# Patient Record
Sex: Female | Born: 1965 | Race: White | Hispanic: No | Marital: Single
Health system: Southern US, Community
[De-identification: ages and names within clinical notes are randomized; demographics above are authoritative.]

## PROBLEM LIST (undated history)

## (undated) DIAGNOSIS — R011 Cardiac murmur, unspecified: Secondary | ICD-10-CM

## (undated) DIAGNOSIS — D649 Anemia, unspecified: Secondary | ICD-10-CM

## (undated) DIAGNOSIS — E288 Other ovarian dysfunction: Secondary | ICD-10-CM

## (undated) DIAGNOSIS — I10 Essential (primary) hypertension: Secondary | ICD-10-CM

## (undated) DIAGNOSIS — N390 Urinary tract infection, site not specified: Secondary | ICD-10-CM

## (undated) DIAGNOSIS — G4700 Insomnia, unspecified: Secondary | ICD-10-CM

## (undated) DIAGNOSIS — Z9889 Other specified postprocedural states: Secondary | ICD-10-CM

## (undated) DIAGNOSIS — R51 Headache: Secondary | ICD-10-CM

## (undated) DIAGNOSIS — I341 Nonrheumatic mitral (valve) prolapse: Secondary | ICD-10-CM

## (undated) DIAGNOSIS — E2839 Other primary ovarian failure: Secondary | ICD-10-CM

## (undated) DIAGNOSIS — R519 Headache, unspecified: Secondary | ICD-10-CM

## (undated) DIAGNOSIS — F419 Anxiety disorder, unspecified: Secondary | ICD-10-CM

## (undated) HISTORY — DX: Headache, unspecified: R51.9

## (undated) HISTORY — PX: ABDOMINAL HYSTERECTOMY: SHX81

## (undated) HISTORY — DX: Nonrheumatic mitral (valve) prolapse: I34.1

## (undated) HISTORY — DX: Other ovarian dysfunction: E28.8

## (undated) HISTORY — DX: Headache: R51

## (undated) HISTORY — DX: Anemia, unspecified: D64.9

## (undated) HISTORY — DX: Other primary ovarian failure: E28.39

## (undated) HISTORY — DX: Essential (primary) hypertension: I10

## (undated) HISTORY — PX: BUNIONECTOMY: SHX129

## (undated) HISTORY — DX: Cardiac murmur, unspecified: R01.1

## (undated) HISTORY — DX: Insomnia, unspecified: G47.00

---

## 1998-03-25 ENCOUNTER — Other Ambulatory Visit: Admission: RE | Admit: 1998-03-25 | Discharge: 1998-03-25 | Payer: Self-pay | Admitting: Obstetrics & Gynecology

## 1998-10-11 ENCOUNTER — Ambulatory Visit (HOSPITAL_COMMUNITY): Admission: RE | Admit: 1998-10-11 | Discharge: 1998-10-11 | Payer: Self-pay | Admitting: Gastroenterology

## 1999-05-22 ENCOUNTER — Inpatient Hospital Stay (HOSPITAL_COMMUNITY): Admission: AD | Admit: 1999-05-22 | Discharge: 1999-05-22 | Payer: Self-pay | Admitting: Obstetrics & Gynecology

## 1999-07-08 ENCOUNTER — Ambulatory Visit (HOSPITAL_COMMUNITY): Admission: RE | Admit: 1999-07-08 | Discharge: 1999-07-08 | Payer: Self-pay | Admitting: Obstetrics and Gynecology

## 1999-07-08 ENCOUNTER — Encounter: Payer: Self-pay | Admitting: Obstetrics and Gynecology

## 2000-02-16 ENCOUNTER — Encounter: Payer: Self-pay | Admitting: Gynecology

## 2000-02-16 ENCOUNTER — Ambulatory Visit (HOSPITAL_COMMUNITY): Admission: RE | Admit: 2000-02-16 | Discharge: 2000-02-16 | Payer: Self-pay | Admitting: Gynecology

## 2000-04-13 ENCOUNTER — Encounter: Admission: RE | Admit: 2000-04-13 | Discharge: 2000-04-13 | Payer: Self-pay | Admitting: Obstetrics and Gynecology

## 2000-04-13 ENCOUNTER — Encounter: Payer: Self-pay | Admitting: Obstetrics and Gynecology

## 2000-04-18 ENCOUNTER — Other Ambulatory Visit: Admission: RE | Admit: 2000-04-18 | Discharge: 2000-04-18 | Payer: Self-pay | Admitting: Obstetrics and Gynecology

## 2000-09-25 HISTORY — PX: HYSTEROSCOPY: SHX211

## 2001-03-04 ENCOUNTER — Other Ambulatory Visit: Admission: RE | Admit: 2001-03-04 | Discharge: 2001-03-04 | Payer: Self-pay | Admitting: *Deleted

## 2001-06-28 ENCOUNTER — Other Ambulatory Visit: Admission: RE | Admit: 2001-06-28 | Discharge: 2001-06-28 | Payer: Self-pay | Admitting: *Deleted

## 2001-06-28 ENCOUNTER — Encounter (INDEPENDENT_AMBULATORY_CARE_PROVIDER_SITE_OTHER): Payer: Self-pay | Admitting: Specialist

## 2001-12-31 ENCOUNTER — Encounter: Admission: RE | Admit: 2001-12-31 | Discharge: 2001-12-31 | Payer: Self-pay | Admitting: Obstetrics and Gynecology

## 2001-12-31 ENCOUNTER — Encounter: Payer: Self-pay | Admitting: Obstetrics and Gynecology

## 2002-03-27 ENCOUNTER — Other Ambulatory Visit: Admission: RE | Admit: 2002-03-27 | Discharge: 2002-03-27 | Payer: Self-pay | Admitting: Obstetrics and Gynecology

## 2002-07-26 ENCOUNTER — Inpatient Hospital Stay (HOSPITAL_COMMUNITY): Admission: AD | Admit: 2002-07-26 | Discharge: 2002-07-29 | Payer: Self-pay | Admitting: Obstetrics and Gynecology

## 2002-07-27 ENCOUNTER — Encounter (INDEPENDENT_AMBULATORY_CARE_PROVIDER_SITE_OTHER): Payer: Self-pay | Admitting: Specialist

## 2002-07-30 ENCOUNTER — Encounter: Admission: RE | Admit: 2002-07-30 | Discharge: 2002-08-29 | Payer: Self-pay | Admitting: Obstetrics and Gynecology

## 2002-09-23 ENCOUNTER — Other Ambulatory Visit: Admission: RE | Admit: 2002-09-23 | Discharge: 2002-09-23 | Payer: Self-pay | Admitting: Obstetrics and Gynecology

## 2004-04-11 ENCOUNTER — Other Ambulatory Visit: Admission: RE | Admit: 2004-04-11 | Discharge: 2004-04-11 | Payer: Self-pay | Admitting: Obstetrics and Gynecology

## 2005-12-01 ENCOUNTER — Encounter: Admission: RE | Admit: 2005-12-01 | Discharge: 2005-12-01 | Payer: Self-pay | Admitting: *Deleted

## 2006-04-12 ENCOUNTER — Encounter: Admission: RE | Admit: 2006-04-12 | Discharge: 2006-04-12 | Payer: Self-pay | Admitting: *Deleted

## 2009-03-02 ENCOUNTER — Ambulatory Visit: Payer: Self-pay | Admitting: Internal Medicine

## 2009-11-22 ENCOUNTER — Other Ambulatory Visit: Admission: RE | Admit: 2009-11-22 | Discharge: 2009-11-22 | Payer: Self-pay | Admitting: Obstetrics and Gynecology

## 2009-11-22 ENCOUNTER — Ambulatory Visit: Payer: Self-pay | Admitting: Women's Health

## 2009-12-06 ENCOUNTER — Ambulatory Visit: Payer: Self-pay | Admitting: Women's Health

## 2010-05-10 ENCOUNTER — Encounter: Admission: RE | Admit: 2010-05-10 | Discharge: 2010-05-10 | Payer: Self-pay | Admitting: Gynecology

## 2010-05-10 IMAGING — MG MM DIGITAL DIAGNOSTIC BILAT
5 series · 5 of 5 positions shown · non-contrast
Comparison: [DATE] and [DATE]

CLINICAL DATA: History of right breast cyst 9 o'clock location.
The patient was recommended to return for short-term follow-up in 6
months but did not return.  She is due for bilateral mammography
today.

DIGITAL DIAGNOSTIC BILATERAL MAMMOGRAM WITH CAD

[R CC (1 of 2)]
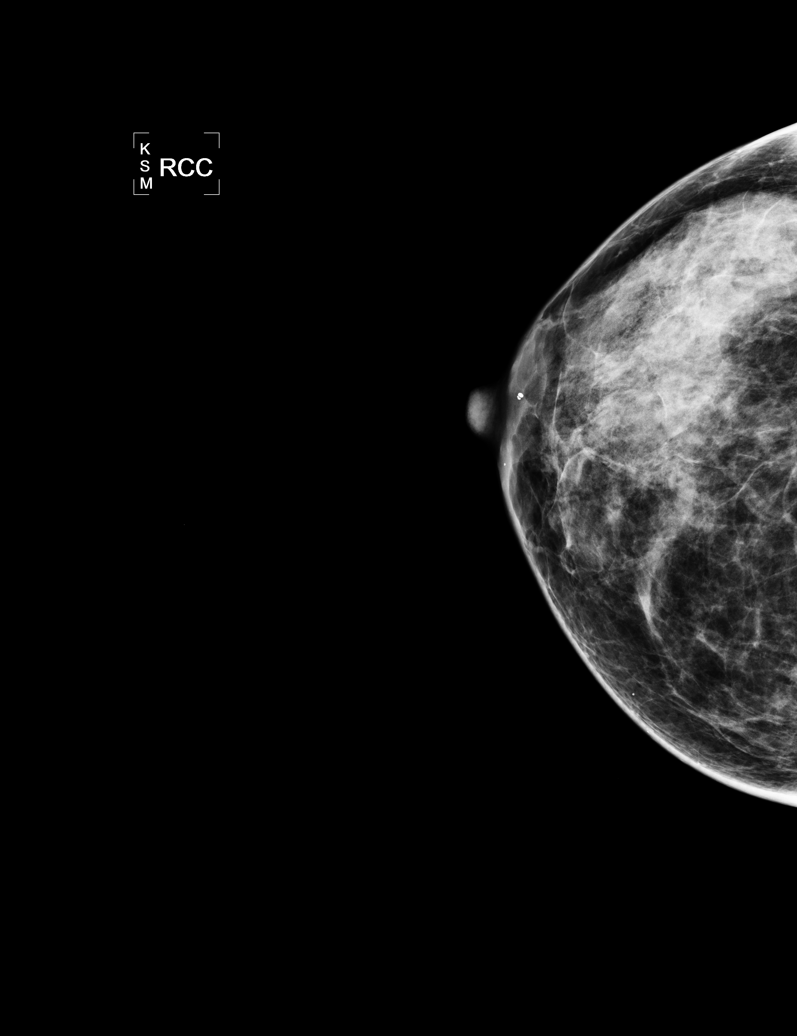

[L CC]
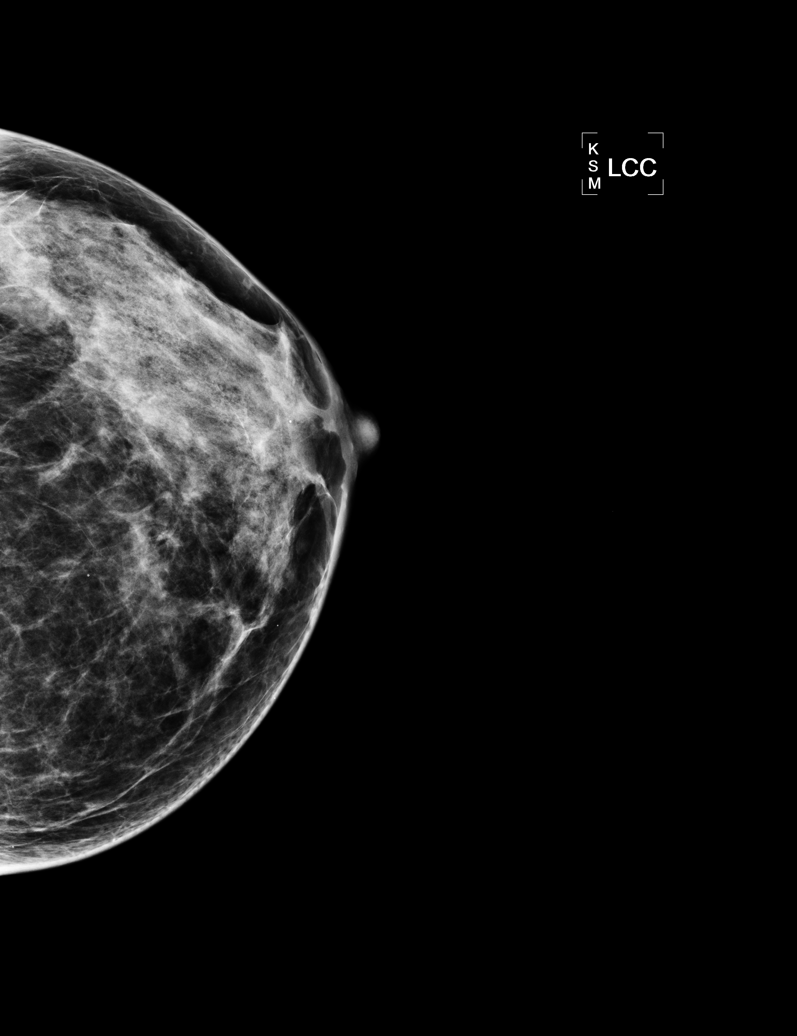

[L MLO]
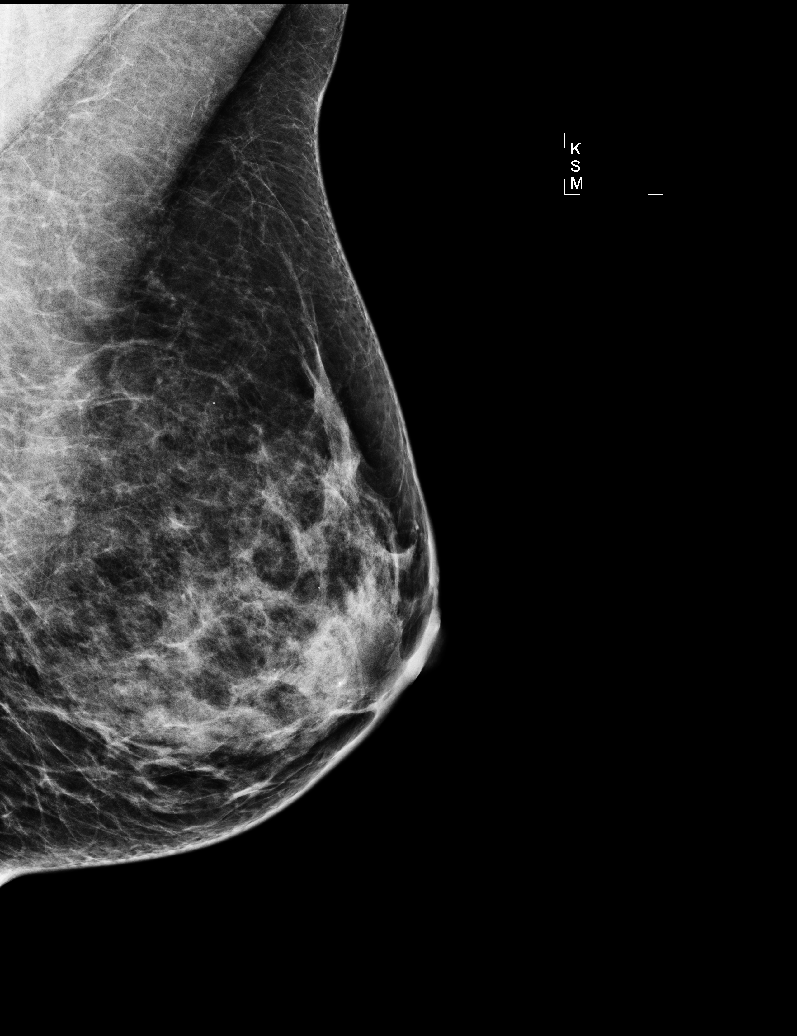

[R MLO]
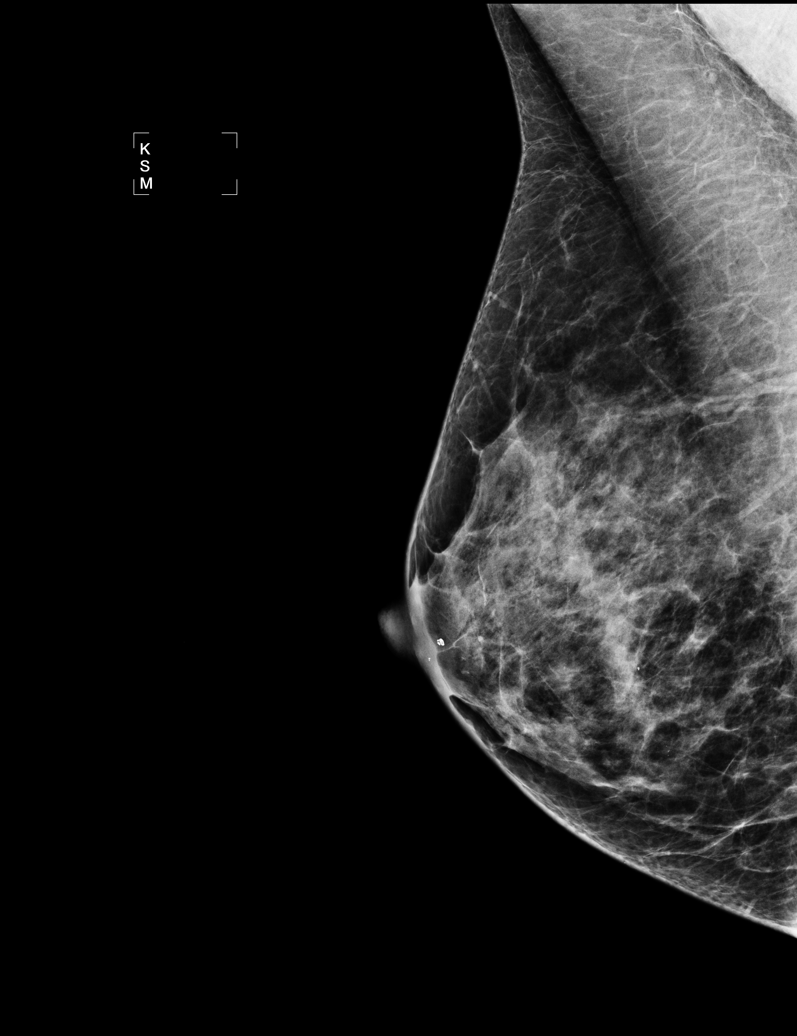

[R CC (2 of 2)]
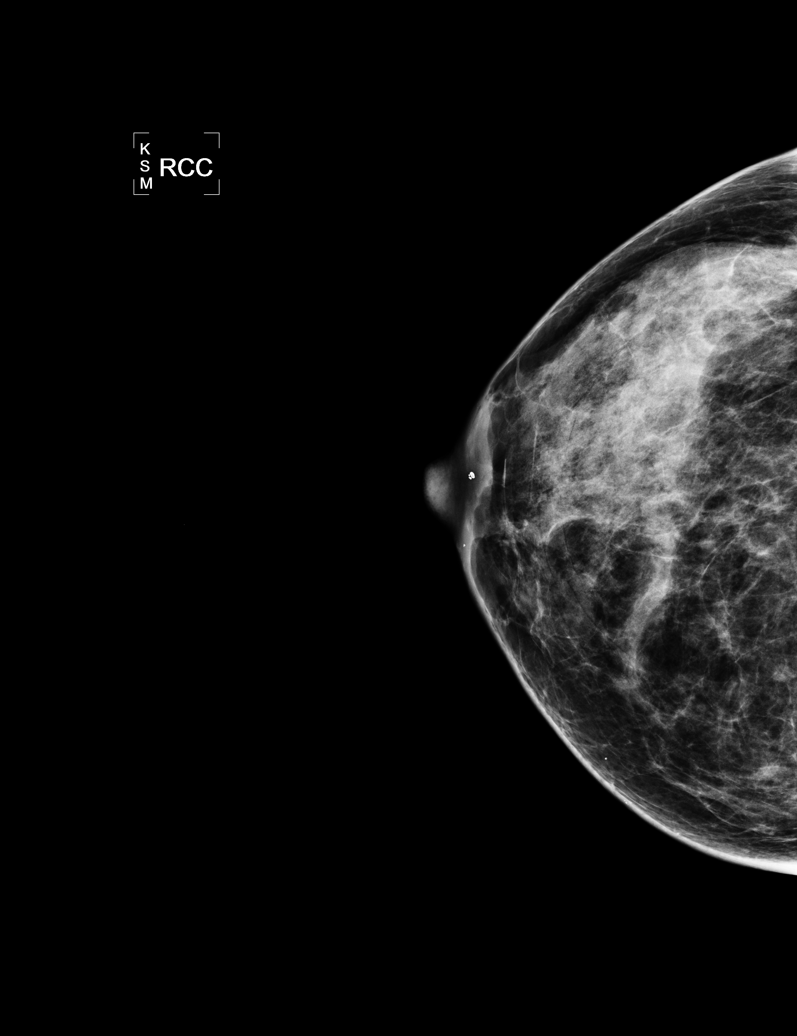

[5 of 5 positions shown; findings below may reference images not displayed]

FINDINGS: The breast parenchyma is heterogeneously dense, which
may limit the sensitivity of mammography.  No suspicious mass,
calcification, or architectural distortion is seen.
Mammographic images were processed with CAD.
IMPRESSION: No evidence for malignancy in either breast.  No change since prior
exam. Screening mammography is recommended in one year. Findings
and recommendations discussed with the patient and provided in
written form at the time of the exam.

BI-RADS CATEGORY 1:  Negative.

## 2010-11-07 ENCOUNTER — Encounter (INDEPENDENT_AMBULATORY_CARE_PROVIDER_SITE_OTHER): Payer: PRIVATE HEALTH INSURANCE | Admitting: Internal Medicine

## 2010-11-07 DIAGNOSIS — F411 Generalized anxiety disorder: Secondary | ICD-10-CM

## 2010-11-07 DIAGNOSIS — Z0000 Encounter for general adult medical examination without abnormal findings: Secondary | ICD-10-CM

## 2011-04-03 ENCOUNTER — Other Ambulatory Visit: Payer: Self-pay | Admitting: Gynecology

## 2011-04-03 ENCOUNTER — Other Ambulatory Visit (HOSPITAL_COMMUNITY)
Admission: RE | Admit: 2011-04-03 | Discharge: 2011-04-03 | Disposition: A | Discharge status: Home / Self Care | Payer: 59 | Source: Ambulatory Visit | Attending: Gynecology | Admitting: Gynecology

## 2011-04-03 ENCOUNTER — Encounter (INDEPENDENT_AMBULATORY_CARE_PROVIDER_SITE_OTHER): Payer: 59 | Admitting: Gynecology

## 2011-04-03 DIAGNOSIS — B373 Candidiasis of vulva and vagina: Secondary | ICD-10-CM

## 2011-04-03 DIAGNOSIS — Z13220 Encounter for screening for lipoid disorders: Secondary | ICD-10-CM

## 2011-04-03 DIAGNOSIS — Z833 Family history of diabetes mellitus: Secondary | ICD-10-CM

## 2011-04-03 DIAGNOSIS — R823 Hemoglobinuria: Secondary | ICD-10-CM

## 2011-04-03 DIAGNOSIS — N926 Irregular menstruation, unspecified: Secondary | ICD-10-CM

## 2011-04-03 DIAGNOSIS — Z124 Encounter for screening for malignant neoplasm of cervix: Secondary | ICD-10-CM | POA: Insufficient documentation

## 2011-04-03 DIAGNOSIS — Z01419 Encounter for gynecological examination (general) (routine) without abnormal findings: Secondary | ICD-10-CM

## 2011-04-07 ENCOUNTER — Ambulatory Visit (INDEPENDENT_AMBULATORY_CARE_PROVIDER_SITE_OTHER): Payer: 59 | Admitting: Gynecology

## 2011-04-07 DIAGNOSIS — N926 Irregular menstruation, unspecified: Secondary | ICD-10-CM

## 2011-04-10 ENCOUNTER — Ambulatory Visit: Payer: 59 | Admitting: Gynecology

## 2011-04-11 ENCOUNTER — Encounter: Payer: Self-pay | Admitting: *Deleted

## 2011-05-01 ENCOUNTER — Telehealth: Payer: Self-pay | Admitting: *Deleted

## 2011-05-01 DIAGNOSIS — B373 Candidiasis of vulva and vagina: Secondary | ICD-10-CM

## 2011-05-01 NOTE — Telephone Encounter (Signed)
PT CALLING STATING SHE HAS A YEAST INFECTION. WHITE DISCHARGE, ITCHING X 3 DAYS NOW. PT STATES THAT SHE PICKED UP A DIFLUCAN FROM PHARMACY. PT WANTS TO KNOW IF SHE COULD HAVE MORE FOR HER RECURRENT YEAST INFECTIONS. PLEASE ADVISE.

## 2011-05-02 MED ORDER — FLUCONAZOLE 150 MG PO TABS: 150.0000 mg | ORAL_TABLET | Freq: Once | ORAL | Status: AC

## 2011-05-02 NOTE — Telephone Encounter (Signed)
LM ON PT VM THAT RX HAS BEEN SENT. AND TO FOLLOW UP OFFICE VISIT IF SYMPTOMS CONTINUE.

## 2011-05-02 NOTE — Telephone Encounter (Signed)
I prescribed Diflucan 150 mg with 2 refills. If she continues to have more recurrences that I recommend office visit to discuss suppressive therapy.

## 2011-05-08 ENCOUNTER — Ambulatory Visit: Payer: PRIVATE HEALTH INSURANCE | Admitting: Internal Medicine

## 2011-05-10 ENCOUNTER — Other Ambulatory Visit: Payer: Self-pay

## 2011-05-10 ENCOUNTER — Encounter: Payer: Self-pay | Admitting: Gynecology

## 2011-05-10 ENCOUNTER — Other Ambulatory Visit: Payer: 59

## 2011-05-10 ENCOUNTER — Ambulatory Visit (INDEPENDENT_AMBULATORY_CARE_PROVIDER_SITE_OTHER): Payer: 59 | Admitting: Gynecology

## 2011-05-10 DIAGNOSIS — N938 Other specified abnormal uterine and vaginal bleeding: Secondary | ICD-10-CM

## 2011-05-10 DIAGNOSIS — Z803 Family history of malignant neoplasm of breast: Secondary | ICD-10-CM

## 2011-05-10 DIAGNOSIS — N898 Other specified noninflammatory disorders of vagina: Secondary | ICD-10-CM

## 2011-05-10 DIAGNOSIS — N949 Unspecified condition associated with female genital organs and menstrual cycle: Secondary | ICD-10-CM

## 2011-05-10 DIAGNOSIS — L293 Anogenital pruritus, unspecified: Secondary | ICD-10-CM

## 2011-05-10 DIAGNOSIS — R142 Eructation: Secondary | ICD-10-CM

## 2011-05-10 DIAGNOSIS — B373 Candidiasis of vulva and vagina: Secondary | ICD-10-CM

## 2011-05-10 DIAGNOSIS — R140 Abdominal distension (gaseous): Secondary | ICD-10-CM

## 2011-05-10 MED ORDER — FLUCONAZOLE 150 MG PO TABS: 150.0000 mg | ORAL_TABLET | Freq: Once | ORAL | Status: AC

## 2011-05-10 MED ORDER — FLUCONAZOLE 200 MG PO TABS: 200.0000 mg | ORAL_TABLET | Freq: Every day | ORAL | Status: AC

## 2011-05-10 NOTE — Progress Notes (Signed)
Patient presents several issues. #1 recurrent vaginitis history of frequent yeast infections.  She was recently treated with Diflucan but this seemed to have failed. She is on a chronic suppressive dose of antibiotics for recurrent UTIs.  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  An ultrasound today to look at endometrial stripe and to check the ovaries for activity.  Ultrasound shows endometrial echo at 7.7 mm right left ovaries with echo-free avascular cysts on the left 24 mm on the right 21 mm.  Exam External BUS vagina with white discharge KOH wet prep done cervix normal, uterus normal size midline mobile nontender adnexa without masses or tenderness  #1  Wet prep positive for yeast we'll treat with Diflucan 200 mg daily for 5 days. I gave her a separate prescription for Diflucan 150 mg #4 with 2 refills of available to treat intermittently as needed for recurrences. We discussed alternative options such as boric acid suppositories she apparently tried this previously and didn't like the mess. #2 History of premature ovarian failure. Ultrasound shows a ovarian activity endometrial echo is 7.7 mm. Her FSH is normal her estradiol is 230. Will plan observation and she'll keep a menstrual calendar if she resumes relatively regular menses we'll follow if significant irregular bleeding she'll return present for evaluation. Using vasectomy birth control.

## 2011-06-14 ENCOUNTER — Encounter: Payer: Self-pay | Admitting: Gynecology

## 2011-07-13 ENCOUNTER — Telehealth: Payer: Self-pay | Admitting: Internal Medicine

## 2011-07-14 ENCOUNTER — Other Ambulatory Visit: Payer: Self-pay

## 2011-07-14 MED ORDER — VALACYCLOVIR HCL 500 MG PO TABS: 500.0000 mg | ORAL_TABLET | Freq: Two times a day (BID) | ORAL | Status: AC

## 2011-07-14 NOTE — Telephone Encounter (Signed)
rx called to pharmacy and patient informed

## 2011-07-14 NOTE — Telephone Encounter (Signed)
RX: Valtrex 500mg  #10 one po bid with prn 0ne year refills to be called in by nurse

## 2011-08-04 ENCOUNTER — Telehealth: Payer: Self-pay | Admitting: *Deleted

## 2011-08-04 MED ORDER — FLUCONAZOLE 150 MG PO TABS: 150.0000 mg | ORAL_TABLET | Freq: Once | ORAL | Status: AC

## 2011-08-04 NOTE — Telephone Encounter (Signed)
Pt calling complaining of recurrent  yeast infection, she had two refills on diflucan 150 mg on 05/10/11 and was told if symptoms should occur to make office visit. Pt informed with this, called today and is leaving town on Sunday and wants rx for diflucan. Please advise.

## 2011-08-04 NOTE — Telephone Encounter (Signed)
Okay for one refill of Diflucan 150x1 order was placed

## 2011-08-04 NOTE — Telephone Encounter (Signed)
Pt informed with the below note. 

## 2011-08-04 NOTE — Telephone Encounter (Signed)
error 

## 2011-09-04 ENCOUNTER — Ambulatory Visit: Payer: 59 | Admitting: Gynecology

## 2011-09-05 ENCOUNTER — Encounter: Payer: Self-pay | Admitting: Gynecology

## 2011-09-05 ENCOUNTER — Ambulatory Visit (INDEPENDENT_AMBULATORY_CARE_PROVIDER_SITE_OTHER): Payer: 59 | Admitting: Gynecology

## 2011-09-05 DIAGNOSIS — N898 Other specified noninflammatory disorders of vagina: Secondary | ICD-10-CM

## 2011-09-05 DIAGNOSIS — N926 Irregular menstruation, unspecified: Secondary | ICD-10-CM

## 2011-09-05 DIAGNOSIS — B373 Candidiasis of vulva and vagina: Secondary | ICD-10-CM

## 2011-09-05 DIAGNOSIS — B3731 Acute candidiasis of vulva and vagina: Secondary | ICD-10-CM

## 2011-09-05 MED ORDER — MEDROXYPROGESTERONE ACETATE 10 MG PO TABS: 10.0000 mg | ORAL_TABLET | Freq: Two times a day (BID) | ORAL | Status: DC

## 2011-09-05 MED ORDER — FLUCONAZOLE 200 MG PO TABS: 200.0000 mg | ORAL_TABLET | Freq: Every day | ORAL | Status: AC

## 2011-09-05 NOTE — Progress Notes (Signed)
Patient presents with recurrent vaginal discharge and itching. She has been treated for recurrent yeast infections.  She has been checked as far as glucose in the past and has been normal. Patient also notes her periods are a little irregular she will go up to 3 months without a period. She does have a history of premature menopause with elevated FSH in the past and then most recently has resumed menses with a normal FSH and estradiol level. She is using vasectomy for birth control.  Exam with chaperone present Pelvic external BUS vagina with white discharge KOH wet prep done. Cervix normal. Uterus retroverted normal size midline mobile nontender. Adnexa without masses or tenderness  Assessment and plan: 1. Recurrent yeast vaginitis. Her wet prep is positive for yeast. We'll treat with Diflucan 200 daily for 7 days then 200 weekly for 6 months. Patient will follow up if she has any recurrences. 2. Irregular menses. I think this is irregular ovulation. Discussed regulation with a Provera 10 mg twice a day x5 day withdrawal if she does not have a period every 5-6 weeks. She does have a history of normal hormone studies as far as TSH, prolactin, FSH this past July. If significant irregularity despite this she will call for further evaluation. Otherwise she is due for her annual in July and I reminded her to schedule this.

## 2011-09-05 NOTE — Patient Instructions (Addendum)
Take Diflucan and Provera as discussed. If recurrent vaginal symptoms or menstrual irregularity represent for further evaluation.  Otherwise follow up in July when you're due for your annual exam.

## 2011-10-03 ENCOUNTER — Telehealth: Payer: Self-pay | Admitting: *Deleted

## 2011-10-03 ENCOUNTER — Encounter: Payer: Self-pay | Admitting: *Deleted

## 2011-10-03 DIAGNOSIS — N852 Hypertrophy of uterus: Secondary | ICD-10-CM

## 2011-10-03 NOTE — Telephone Encounter (Signed)
I would recommend that the patient schedule a GYN ultrasound here as there ultrasound is very limited meant to look at the bladder.  I can then take a look at the ultrasound and we can further discuss.

## 2011-10-03 NOTE — Progress Notes (Signed)
Patient ID: Michelle Kramer, female   DOB: 1965/10/08, 46 y.o.   MRN: [id number] Pt called Friday wanting to know if we received that notes from [facility] urology office, left message on pt vm that notes have been reviewed.

## 2011-10-03 NOTE — Telephone Encounter (Signed)
Pt had appointment with [facility] on 09/29/11 and was told that she appeared to have a enlarged uterus, pt wants to know if she should make appointment based on the results? Office note has been reviewed and signed off by you. Please advise

## 2011-10-04 NOTE — Telephone Encounter (Signed)
Pt informed with the below note and will call back to make appointment.

## 2011-10-04 NOTE — Telephone Encounter (Signed)
Pt informed with the below note, she will call back to schedule.

## 2011-10-16 ENCOUNTER — Ambulatory Visit (INDEPENDENT_AMBULATORY_CARE_PROVIDER_SITE_OTHER): Payer: 59 | Admitting: Gynecology

## 2011-10-16 ENCOUNTER — Ambulatory Visit (INDEPENDENT_AMBULATORY_CARE_PROVIDER_SITE_OTHER): Payer: 59

## 2011-10-16 ENCOUNTER — Encounter: Payer: Self-pay | Admitting: Gynecology

## 2011-10-16 DIAGNOSIS — N852 Hypertrophy of uterus: Secondary | ICD-10-CM

## 2011-10-16 DIAGNOSIS — O34539 Maternal care for retroversion of gravid uterus, unspecified trimester: Secondary | ICD-10-CM

## 2011-10-16 DIAGNOSIS — N854 Malposition of uterus: Secondary | ICD-10-CM

## 2011-10-16 MED ORDER — FLUCONAZOLE 200 MG PO TABS: 200.0000 mg | ORAL_TABLET | Freq: Every day | ORAL | Status: AC

## 2011-10-16 NOTE — Patient Instructions (Signed)
Follow up in 3 months for reexamination. 

## 2011-10-16 NOTE — Progress Notes (Signed)
Patient presents having been seen at [facility] urology for recurrent UTIs. They did an ultrasound for urinary retention and noted that her uterus looked more generous and referred her to me. She does have a history of heavier periods and irregular periods that we have discussed with her last office visit.   Exam with Sherrilyn Rist chaperone present External BUS vagina with menses flow. Cervix normal. Uterus retroverted globoid generous in size. Adnexa without masses or tenderness  Ultrasound shows endometrial echo at 4.0 mm. Uterus is generous at 126 mm x 69 mm x 49 mm. No specific lesions such as leiomyoma or adenomyosis suspect noted. Right and left ovaries visualized and normal.  Assessment and plan: Generous size uterus no specific lesions. Ultrasound in August 2012 showed measurements at 87 mm x 72 mm x 56 mm. Does seem to have some enlargement since then. Reviewed all this with the patient recommended she represent in 3 months for pelvic exam as she is easy to exam and and will reevaluate and see what she's doing at that time. She is comfortable with the plan.

## 2012-01-02 ENCOUNTER — Ambulatory Visit: Payer: 59 | Admitting: Gynecology

## 2012-01-04 ENCOUNTER — Ambulatory Visit (INDEPENDENT_AMBULATORY_CARE_PROVIDER_SITE_OTHER): Payer: 59 | Admitting: Gynecology

## 2012-01-04 ENCOUNTER — Encounter: Payer: Self-pay | Admitting: Gynecology

## 2012-01-04 VITALS — BP 118/72

## 2012-01-04 DIAGNOSIS — N926 Irregular menstruation, unspecified: Secondary | ICD-10-CM

## 2012-01-04 DIAGNOSIS — N920 Excessive and frequent menstruation with regular cycle: Secondary | ICD-10-CM

## 2012-01-04 MED ORDER — CEPHALEXIN 250 MG PO TABS: 250.0000 mg | ORAL_TABLET | ORAL | Status: DC | PRN

## 2012-01-04 MED ORDER — CLONAZEPAM 0.5 MG PO TABS: 0.5000 mg | ORAL_TABLET | Freq: Two times a day (BID) | ORAL | Status: DC | PRN

## 2012-01-04 NOTE — Patient Instructions (Signed)
Office will contact you to schedule surgery. 

## 2012-01-04 NOTE — Progress Notes (Signed)
Patient presents in follow up. She has a long history of irregular menses for years. Went through a transient premature menopause with elevated FSH and cessation of menses. She then resumed ovarian function and now has irregular menses. She'll bleed up to 2 weeks straight. Recent evaluation with ultrasound showed an enlarged uterus without focal abnormalities suspicious for adenomyosis. She's also having deep dyspareunia with intercourse as if something is being hit. Her uterus is retroverted. I asked her to come back in 3 months and follow up just to make sure that her uterus is not continuing to enlarge in and see how her menstrual pattern is. She did have normal hormone studies this past year.  Exam with Elane Fritz chaperone present External BUS vagina normal. Cervix normal. Uterus boggy retroverted. Adnexa without masses or tenderness.  Assessment and plan: Long history of irregular heavy menses. Had undergone testing in the past to include sonohysterogram. Most recent ultrasound shows an enlarged homogeneous myometrial pattern, thin endometrium at 4 mm.  Options for management were reviewed to include expectant management, hormonal manipulation such as oral contraceptives, Mirena IUD, endometrial ablation, hysterectomy. Coverings issue is her husband has a vasectomy and she wants no further children. She had been on oral contraceptives previously but had a hypertensive reaction. After lengthy discussion as to the pros/cons, risks/benefits of each choice she wants to proceed with hysterectomy which I think given her total picture is very reasonable. The ovarian conservation issue was reviewed. She has somewhat interesting history of premature menopause which then spontaneously resolved. The issues of continued hormone production versus removing her ovaries in the issues of symptoms, accelerated cardiovascular and osteoporotic risks were reviewed. The risks of ovarian disease and ovarian cancer if she keeps her  ovaries was discussed. Patient would want to keep her ovaries accepts the risks of ovarian disease. I think she would be a candidate for a vaginal approach but understands that a laparoscopic assistance up to and including TAH may be required and she would understand and accept this. Ahead and move towards scheduling this for her and she'll represent for a full preop consult.  She did ask if I could refill her cephalexin that she uses for UTI suppression as per urology and I did this for her as well as her clonazepam that she uses as a sleep aid that she normally gets through her primary physician. I prescribed #30 for both with 2 refills.

## 2012-01-09 ENCOUNTER — Telehealth: Payer: Self-pay

## 2012-01-09 NOTE — Telephone Encounter (Signed)
I called patient to schedule Vag Hyst. Per Dr. Velvet Bathe.  Patient said she was having second thoughts. She said she thought she asked all her questions the day she was here but after she got home she thought of others.  She had quite a few questions and I offered consult to come back and talk with Dr. Velvet Bathe but she said she had missed work several times to come in about this and if she could just get answers to her questions through me she would appreciate it.  1.  She asked about endometrial ablation.  I explained it to her.  She asked if that would be an option for her?    2.  Would endo ablation help to shrink her "enlarged uterus" ? (She is concerned that doing the ablation would not addressed the enlarged uterus.)  3.  She said that you told her you thought it was probably adenomyosis causing her uterus to be enlarged. She is bothered it was not confirmed.  She wonders could it be anything else causing it to be enlarged and is there anyway to know for sure (besides Hysterectomy)?  4.  She questioned if she should just go ahead and have her uterus removed in light of the enlargement and not even entertain the idea of ablation?

## 2012-01-10 NOTE — Telephone Encounter (Signed)
I called the patient on the phone and discussed the questions that she had. She wondered about endometrial ablation which I think from a heavy period standpoint would be appropriate but from her irregular standpoint that she may continue to have lighter but irregular menses. She's also having deep dyspareunia which I think is due to her retroverted uterus and I'm not sure that the ablation would address this issue. If we do want to consider the ablation or observation I recommended an endometrial sample given irregular bleeding just to make sure as a baseline if her endometrium is normal. If we decide for hysterectomy then I do not think this is necessary as we will obviously be providing them with the specimen. After a lengthy discussion the patient wants to go ahead with hysterectomy and will schedule this at her convenience. We did discuss the ovarian conservation issue again particularly in light of her past history of premature menopause and now reawakening of ovarian function. She does want to keep both ovaries if possible and accepts risk of ovarian disease both benign and malignant in the future.

## 2012-01-22 ENCOUNTER — Telehealth: Payer: Self-pay | Admitting: Gynecology

## 2012-01-22 NOTE — Telephone Encounter (Signed)
Patient called and asked me to cancel her surgery for June 9.  She said she is just too "on the fence" about it.  She would like to get a 2nd opinion.  She said she needs to be at peace about her decision.  She asked me to explain to Dr. Velvet Bathe that she wants him to do her surgery if she can feel more confident about having it and know that she is doing the right thing.  She asked for a records release form and I have faxed that to her.  She will call me when/if she decides she wants to re-schedule her hysterectomy.

## 2012-02-22 ENCOUNTER — Institutional Professional Consult (permissible substitution): Payer: 59 | Admitting: Gynecology

## 2012-02-29 ENCOUNTER — Encounter (HOSPITAL_COMMUNITY): Admission: RE | Payer: Self-pay | Source: Ambulatory Visit

## 2012-02-29 ENCOUNTER — Ambulatory Visit (HOSPITAL_COMMUNITY): Admission: RE | Admit: 2012-02-29 | Payer: 59 | Source: Ambulatory Visit | Admitting: Gynecology

## 2012-02-29 SURGERY — HYSTERECTOMY, VAGINAL
Anesthesia: General

## 2012-03-11 ENCOUNTER — Other Ambulatory Visit: Payer: Self-pay | Admitting: Obstetrics

## 2012-03-11 DIAGNOSIS — N644 Mastodynia: Secondary | ICD-10-CM

## 2012-03-18 ENCOUNTER — Ambulatory Visit
Admission: RE | Admit: 2012-03-18 | Discharge: 2012-03-18 | Disposition: A | Discharge status: Home / Self Care | Payer: 59 | Source: Ambulatory Visit | Attending: Obstetrics | Admitting: Obstetrics

## 2012-03-18 ENCOUNTER — Other Ambulatory Visit: Payer: Self-pay | Admitting: Obstetrics

## 2012-03-18 DIAGNOSIS — N644 Mastodynia: Secondary | ICD-10-CM

## 2012-05-10 ENCOUNTER — Encounter (HOSPITAL_COMMUNITY): Payer: Self-pay | Admitting: Pharmacist

## 2012-05-10 ENCOUNTER — Other Ambulatory Visit: Payer: Self-pay | Admitting: Obstetrics

## 2012-05-14 ENCOUNTER — Encounter (HOSPITAL_COMMUNITY): Payer: Self-pay

## 2012-05-14 ENCOUNTER — Encounter (HOSPITAL_COMMUNITY)
Admission: RE | Admit: 2012-05-14 | Discharge: 2012-05-14 | Disposition: A | Discharge status: Home / Self Care | Payer: 59 | Source: Ambulatory Visit | Attending: Obstetrics | Admitting: Obstetrics

## 2012-05-14 HISTORY — DX: Anxiety disorder, unspecified: F41.9

## 2012-05-14 HISTORY — DX: Urinary tract infection, site not specified: N39.0

## 2012-05-14 LAB — CBC
HCT: 37.4 % (ref 36.0–46.0)
Hemoglobin: 12.2 g/dL (ref 12.0–15.0)
MCH: 28.1 pg (ref 26.0–34.0)
MCHC: 32.6 g/dL (ref 30.0–36.0)
MCV: 86.2 fL (ref 78.0–100.0)
RDW: 13.1 % (ref 11.5–15.5)

## 2012-05-14 NOTE — Patient Instructions (Addendum)
20 Sundai G Riddle  05/14/2012   Your procedure is scheduled on:  05/21/12  Enter through the Main Entrance of [facility] at 6 AM.  Pick up the phone at the desk and dial [phone].   Call this number if you have problems the morning of surgery: [phone]   Remember:   Do not eat food:After Midnight.  Do not drink clear liquids: After Midnight.  Take these medicines the morning of surgery with A SIP OF WATER: Klonopin if needed   Do not wear jewelry, make-up or nail polish.  Do not wear lotions, powders, or perfumes. You may wear deodorant.  Do not shave 48 hours prior to surgery.  Do not bring valuables to the hospital.  Contacts, dentures or bridgework may not be worn into surgery.  Leave suitcase in the car. After surgery it may be brought to your room.  For patients admitted to the hospital, checkout time is 11:00 AM the day of discharge.   Patients discharged the day of surgery will not be allowed to drive home.  Name and phone number of your driver: NA  Special Instructions: CHG Shower Use Special Wash: 1/2 bottle night before surgery and 1/2 bottle morning of surgery.   Please read over the following fact sheets that you were given: MRSA Information

## 2012-05-20 ENCOUNTER — Encounter (HOSPITAL_COMMUNITY): Payer: Self-pay | Admitting: Anesthesiology

## 2012-05-20 NOTE — Anesthesia Preprocedure Evaluation (Addendum)
Anesthesia Evaluation  Patient identified by MRN, date of birth, ID band Patient awake    Reviewed: Allergy & Precautions, H&P , NPO status , Patient's Chart, lab work & pertinent test results  History of Anesthesia Complications (+) PONV  Airway Mallampati: II TM Distance: >3 FB Neck ROM: Full    Dental No notable dental hx. (+) Teeth Intact   Pulmonary neg pulmonary ROS,  breath sounds clear to auscultation  Pulmonary exam normal       Cardiovascular + Valvular Problems/Murmurs MVP Rhythm:Regular Rate:Normal     Neuro/Psych Anxiety    GI/Hepatic negative GI ROS, Neg liver ROS,   Endo/Other  negative endocrine ROS  Renal/GU negative Renal ROS   Chronic UTI    Musculoskeletal negative musculoskeletal ROS (+)   Abdominal   Peds  Hematology negative hematology ROS (+)   Anesthesia Other Findings   Reproductive/Obstetrics Menorrhagia Pelvic Pain                       Anesthesia Physical Anesthesia Plan  ASA: II  Anesthesia Plan: General   Post-op Pain Management:    Induction:   Airway Management Planned: Oral ETT  Additional Equipment:   Intra-op Plan:   Post-operative Plan: Extubation in OR  Informed Consent: I have reviewed the patients History and Physical, chart, labs and discussed the procedure including the risks, benefits and alternatives for the proposed anesthesia with the patient or authorized representative who has indicated his/her understanding and acceptance.   Dental advisory given  Plan Discussed with: CRNA, Anesthesiologist and Surgeon  Anesthesia Plan Comments:         Anesthesia Quick Evaluation

## 2012-05-21 ENCOUNTER — Encounter (HOSPITAL_COMMUNITY)
Admission: RE | Disposition: A | Discharge status: Home / Self Care | Payer: Self-pay | Source: Ambulatory Visit | Attending: Obstetrics

## 2012-05-21 ENCOUNTER — Ambulatory Visit (HOSPITAL_COMMUNITY): Payer: 59 | Admitting: Anesthesiology

## 2012-05-21 ENCOUNTER — Encounter (HOSPITAL_COMMUNITY): Payer: Self-pay

## 2012-05-21 ENCOUNTER — Encounter (HOSPITAL_COMMUNITY): Payer: Self-pay | Admitting: Anesthesiology

## 2012-05-21 ENCOUNTER — Encounter (HOSPITAL_COMMUNITY): Payer: Self-pay | Admitting: *Deleted

## 2012-05-21 ENCOUNTER — Ambulatory Visit (HOSPITAL_COMMUNITY)
Admission: RE | Admit: 2012-05-21 | Discharge: 2012-05-22 | Disposition: A | Discharge status: Home / Self Care | Payer: 59 | Source: Ambulatory Visit | Attending: Obstetrics | Admitting: Obstetrics

## 2012-05-21 DIAGNOSIS — N803 Endometriosis of pelvic peritoneum, unspecified: Secondary | ICD-10-CM | POA: Insufficient documentation

## 2012-05-21 DIAGNOSIS — N920 Excessive and frequent menstruation with regular cycle: Secondary | ICD-10-CM | POA: Insufficient documentation

## 2012-05-21 DIAGNOSIS — N946 Dysmenorrhea, unspecified: Secondary | ICD-10-CM | POA: Insufficient documentation

## 2012-05-21 DIAGNOSIS — Z9889 Other specified postprocedural states: Secondary | ICD-10-CM

## 2012-05-21 DIAGNOSIS — IMO0002 Reserved for concepts with insufficient information to code with codable children: Secondary | ICD-10-CM | POA: Insufficient documentation

## 2012-05-21 DIAGNOSIS — N949 Unspecified condition associated with female genital organs and menstrual cycle: Secondary | ICD-10-CM | POA: Insufficient documentation

## 2012-05-21 HISTORY — PX: LAPAROSCOPIC SUPRACERVICAL HYSTERECTOMY: SHX5399

## 2012-05-21 HISTORY — DX: Other specified postprocedural states: Z98.890

## 2012-05-21 SURGERY — HYSTERECTOMY, SUPRACERVICAL, LAPAROSCOPIC
Anesthesia: General | Site: Abdomen | Wound class: Clean Contaminated

## 2012-05-21 MED ORDER — PROPOFOL 10 MG/ML IV EMUL
INTRAVENOUS | Status: AC
  Filled 2012-05-21: qty 20

## 2012-05-21 MED ORDER — HYDROMORPHONE HCL PF 1 MG/ML IJ SOLN
0.2500 mg | INTRAMUSCULAR | Status: DC | PRN
  Administered 2012-05-21 (×2): 0.5 mg via INTRAVENOUS

## 2012-05-21 MED ORDER — BUPIVACAINE HCL (PF) 0.25 % IJ SOLN
INTRAMUSCULAR | Status: DC | PRN
  Administered 2012-05-21: 7 mL

## 2012-05-21 MED ORDER — HYDROMORPHONE HCL 2 MG PO TABS
4.0000 mg | ORAL_TABLET | ORAL | Status: DC | PRN
  Administered 2012-05-21: 4 mg via ORAL
  Administered 2012-05-21 – 2012-05-22 (×2): 2 mg via ORAL
  Administered 2012-05-22 (×2): 4 mg via ORAL
  Filled 2012-05-21 (×2): qty 1
  Filled 2012-05-21 (×3): qty 2

## 2012-05-21 MED ORDER — HYDROMORPHONE HCL PF 1 MG/ML IJ SOLN: 1.0000 mg | INTRAMUSCULAR | Status: DC | PRN

## 2012-05-21 MED ORDER — ARTIFICIAL TEARS OP OINT
TOPICAL_OINTMENT | OPHTHALMIC | Status: DC | PRN
  Administered 2012-05-21: 1 via OPHTHALMIC

## 2012-05-21 MED ORDER — ONDANSETRON HCL 4 MG PO TABS: 4.0000 mg | ORAL_TABLET | Freq: Four times a day (QID) | ORAL | Status: DC | PRN

## 2012-05-21 MED ORDER — DEXAMETHASONE SODIUM PHOSPHATE 10 MG/ML IJ SOLN
INTRAMUSCULAR | Status: AC
  Filled 2012-05-21: qty 1

## 2012-05-21 MED ORDER — LACTATED RINGERS IV SOLN: INTRAVENOUS | Status: DC

## 2012-05-21 MED ORDER — CLONAZEPAM 0.5 MG PO TABS: 0.2500 mg | ORAL_TABLET | Freq: Every evening | ORAL | Status: DC | PRN

## 2012-05-21 MED ORDER — BUPIVACAINE-EPINEPHRINE (PF) 0.5% -1:200000 IJ SOLN
INTRAMUSCULAR | Status: AC
  Filled 2012-05-21: qty 20

## 2012-05-21 MED ORDER — IBUPROFEN 600 MG PO TABS: 600.0000 mg | ORAL_TABLET | Freq: Four times a day (QID) | ORAL | Status: AC | PRN

## 2012-05-21 MED ORDER — ONDANSETRON HCL 4 MG/2ML IJ SOLN
INTRAMUSCULAR | Status: AC
  Filled 2012-05-21: qty 2

## 2012-05-21 MED ORDER — GLYCOPYRROLATE 0.2 MG/ML IJ SOLN
INTRAMUSCULAR | Status: DC | PRN
  Administered 2012-05-21: 0.4 mg via INTRAVENOUS
  Administered 2012-05-21: 0.1 mg via INTRAVENOUS

## 2012-05-21 MED ORDER — LIDOCAINE HCL (CARDIAC) 20 MG/ML IV SOLN
INTRAVENOUS | Status: DC | PRN
  Administered 2012-05-21: 60 mg via INTRAVENOUS

## 2012-05-21 MED ORDER — SCOPOLAMINE 1 MG/3DAYS TD PT72
1.0000 | MEDICATED_PATCH | Freq: Once | TRANSDERMAL | Status: DC
  Administered 2012-05-21: 1.5 mg via TRANSDERMAL

## 2012-05-21 MED ORDER — LACTATED RINGERS IV SOLN
INTRAVENOUS | Status: DC
  Administered 2012-05-21 (×3): via INTRAVENOUS

## 2012-05-21 MED ORDER — SCOPOLAMINE 1 MG/3DAYS TD PT72
MEDICATED_PATCH | TRANSDERMAL | Status: AC
  Administered 2012-05-21: 1.5 mg via TRANSDERMAL
  Filled 2012-05-21: qty 1

## 2012-05-21 MED ORDER — KETOROLAC TROMETHAMINE 30 MG/ML IJ SOLN: 30.0000 mg | Freq: Four times a day (QID) | INTRAMUSCULAR | Status: DC

## 2012-05-21 MED ORDER — MIDAZOLAM HCL 2 MG/2ML IJ SOLN
INTRAMUSCULAR | Status: AC
  Filled 2012-05-21: qty 2

## 2012-05-21 MED ORDER — METOCLOPRAMIDE HCL 5 MG/ML IJ SOLN: 10.0000 mg | Freq: Once | INTRAMUSCULAR | Status: DC | PRN

## 2012-05-21 MED ORDER — SODIUM CHLORIDE 0.9 % IV SOLN
INTRAVENOUS | Status: DC
  Administered 2012-05-21 (×2): via INTRAVENOUS

## 2012-05-21 MED ORDER — IBUPROFEN 600 MG PO TABS
600.0000 mg | ORAL_TABLET | Freq: Four times a day (QID) | ORAL | Status: DC | PRN
  Administered 2012-05-21 – 2012-05-22 (×4): 600 mg via ORAL
  Filled 2012-05-21 (×4): qty 1

## 2012-05-21 MED ORDER — ONDANSETRON HCL 4 MG/2ML IJ SOLN: 4.0000 mg | Freq: Four times a day (QID) | INTRAMUSCULAR | Status: DC | PRN

## 2012-05-21 MED ORDER — FENTANYL CITRATE 0.05 MG/ML IJ SOLN
INTRAMUSCULAR | Status: AC
  Filled 2012-05-21: qty 2

## 2012-05-21 MED ORDER — NEOSTIGMINE METHYLSULFATE 1 MG/ML IJ SOLN
INTRAMUSCULAR | Status: DC | PRN
  Administered 2012-05-21: 2 mg via INTRAVENOUS

## 2012-05-21 MED ORDER — ONDANSETRON HCL 4 MG/2ML IJ SOLN
INTRAMUSCULAR | Status: DC | PRN
  Administered 2012-05-21: 4 mg via INTRAVENOUS

## 2012-05-21 MED ORDER — ACETAMINOPHEN 10 MG/ML IV SOLN
INTRAVENOUS | Status: DC | PRN
  Administered 2012-05-21: 1000 mg via INTRAVENOUS

## 2012-05-21 MED ORDER — ROCURONIUM BROMIDE 50 MG/5ML IV SOLN
INTRAVENOUS | Status: AC
  Filled 2012-05-21: qty 2

## 2012-05-21 MED ORDER — ACETAMINOPHEN 10 MG/ML IV SOLN
1000.0000 mg | Freq: Four times a day (QID) | INTRAVENOUS | Status: DC
  Filled 2012-05-21 (×4): qty 100

## 2012-05-21 MED ORDER — CEFAZOLIN SODIUM-DEXTROSE 2-3 GM-% IV SOLR
2.0000 g | Freq: Once | INTRAVENOUS | Status: AC
  Administered 2012-05-21: 2 g via INTRAVENOUS

## 2012-05-21 MED ORDER — HYDROMORPHONE HCL PF 1 MG/ML IJ SOLN
INTRAMUSCULAR | Status: AC
  Administered 2012-05-21: 0.5 mg via INTRAVENOUS
  Filled 2012-05-21: qty 1

## 2012-05-21 MED ORDER — CEFAZOLIN SODIUM-DEXTROSE 2-3 GM-% IV SOLR
INTRAVENOUS | Status: AC
  Filled 2012-05-21: qty 50

## 2012-05-21 MED ORDER — ZOLPIDEM TARTRATE 5 MG PO TABS: 5.0000 mg | ORAL_TABLET | Freq: Every evening | ORAL | Status: DC | PRN

## 2012-05-21 MED ORDER — PROPOFOL 10 MG/ML IV EMUL
INTRAVENOUS | Status: DC | PRN
  Administered 2012-05-21: 150 mg via INTRAVENOUS

## 2012-05-21 MED ORDER — PANTOPRAZOLE SODIUM 40 MG PO TBEC
40.0000 mg | DELAYED_RELEASE_TABLET | Freq: Every day | ORAL | Status: DC
  Filled 2012-05-21 (×3): qty 1

## 2012-05-21 MED ORDER — FENTANYL CITRATE 0.05 MG/ML IJ SOLN
INTRAMUSCULAR | Status: AC
  Filled 2012-05-21: qty 5

## 2012-05-21 MED ORDER — MEPERIDINE HCL 25 MG/ML IJ SOLN: 6.2500 mg | INTRAMUSCULAR | Status: DC | PRN

## 2012-05-21 MED ORDER — KETOROLAC TROMETHAMINE 30 MG/ML IJ SOLN
INTRAMUSCULAR | Status: DC | PRN
  Administered 2012-05-21: 30 mg via INTRAVENOUS

## 2012-05-21 MED ORDER — ROCURONIUM BROMIDE 100 MG/10ML IV SOLN
INTRAVENOUS | Status: DC | PRN
  Administered 2012-05-21: 5 mg via INTRAVENOUS
  Administered 2012-05-21 (×2): 20 mg via INTRAVENOUS
  Administered 2012-05-21: 25 mg via INTRAVENOUS
  Administered 2012-05-21: 5 mg via INTRAVENOUS

## 2012-05-21 MED ORDER — DIPHENHYDRAMINE HCL 25 MG PO CAPS: 25.0000 mg | ORAL_CAPSULE | Freq: Four times a day (QID) | ORAL | Status: DC | PRN

## 2012-05-21 MED ORDER — MIDAZOLAM HCL 5 MG/5ML IJ SOLN
INTRAMUSCULAR | Status: DC | PRN
  Administered 2012-05-21: 2 mg via INTRAVENOUS

## 2012-05-21 MED ORDER — BUPIVACAINE HCL (PF) 0.25 % IJ SOLN
INTRAMUSCULAR | Status: AC
  Filled 2012-05-21: qty 60

## 2012-05-21 MED ORDER — DEXAMETHASONE SODIUM PHOSPHATE 10 MG/ML IJ SOLN
INTRAMUSCULAR | Status: DC | PRN
  Administered 2012-05-21: 10 mg via INTRAVENOUS

## 2012-05-21 MED ORDER — PROPOFOL 10 MG/ML IV BOLUS
INTRAVENOUS | Status: DC | PRN
  Administered 2012-05-21: 150 mg via INTRAVENOUS

## 2012-05-21 MED ORDER — MENTHOL 3 MG MT LOZG
1.0000 | LOZENGE | OROMUCOSAL | Status: DC | PRN
  Administered 2012-05-21: 3 mg via ORAL
  Filled 2012-05-21: qty 9

## 2012-05-21 MED ORDER — FENTANYL CITRATE 0.05 MG/ML IJ SOLN
INTRAMUSCULAR | Status: DC | PRN
  Administered 2012-05-21: 50 ug via INTRAVENOUS
  Administered 2012-05-21: 100 ug via INTRAVENOUS
  Administered 2012-05-21 (×2): 50 ug via INTRAVENOUS

## 2012-05-21 MED ORDER — HYDROMORPHONE HCL 4 MG PO TABS: 2.0000 mg | ORAL_TABLET | ORAL | Status: AC | PRN

## 2012-05-21 MED ORDER — LIDOCAINE HCL (CARDIAC) 20 MG/ML IV SOLN
INTRAVENOUS | Status: AC
  Filled 2012-05-21: qty 5

## 2012-05-21 SURGICAL SUPPLY — 71 items
BAG URINE DRAINAGE (UROLOGICAL SUPPLIES) ×3 IMPLANT
BARRIER ADHS 3X4 INTERCEED (GAUZE/BANDAGES/DRESSINGS) ×3 IMPLANT
BLADE LAP MORCELLATOR 15X9.5 (ELECTROSURGICAL) ×3 IMPLANT
BLADE LAPAROSCOPIC MORCELL KIT (BLADE) ×3 IMPLANT
BLADE SURG 15 STRL LF C SS BP (BLADE) ×2 IMPLANT
BLADE SURG 15 STRL SS (BLADE) ×1
CABLE HIGH FREQUENCY MONO STRZ (ELECTRODE) ×3 IMPLANT
CATH FOLEY 3WAY  5CC 16FR (CATHETERS) ×1
CATH FOLEY 3WAY 5CC 16FR (CATHETERS) ×2 IMPLANT
CHLORAPREP W/TINT 26ML (MISCELLANEOUS) ×3 IMPLANT
CLOTH BEACON ORANGE TIMEOUT ST (SAFETY) ×3 IMPLANT
CONT PATH 16OZ SNAP LID 3702 (MISCELLANEOUS) ×3 IMPLANT
COVER MAYO STAND STRL (DRAPES) ×3 IMPLANT
COVER TABLE BACK 60X90 (DRAPES) ×6 IMPLANT
COVER TIP SHEARS 8 DVNC (MISCELLANEOUS) ×2 IMPLANT
COVER TIP SHEARS 8MM DA VINCI (MISCELLANEOUS) ×1
DECANTER SPIKE VIAL GLASS SM (MISCELLANEOUS) ×3 IMPLANT
DERMABOND ADVANCED (GAUZE/BANDAGES/DRESSINGS) ×1
DERMABOND ADVANCED .7 DNX12 (GAUZE/BANDAGES/DRESSINGS) ×2 IMPLANT
DRAPE HUG U DISPOSABLE (DRAPE) ×3 IMPLANT
DRAPE LG THREE QUARTER DISP (DRAPES) ×6 IMPLANT
DRAPE MONITOR DA VINCI (DRAPE) ×3 IMPLANT
DRAPE WARM FLUID 44X44 (DRAPE) ×3 IMPLANT
ELECT REM PT RETURN 9FT ADLT (ELECTROSURGICAL) ×3
ELECTRODE REM PT RTRN 9FT ADLT (ELECTROSURGICAL) ×2 IMPLANT
EVACUATOR SMOKE 8.L (FILTER) ×3 IMPLANT
GAUZE VASELINE 3X9 (GAUZE/BANDAGES/DRESSINGS) IMPLANT
GLOVE BIO SURGEON STRL SZ 6.5 (GLOVE) ×18 IMPLANT
GLOVE BIO SURGEON STRL SZ7 (GLOVE) ×6 IMPLANT
GLOVE BIOGEL PI IND STRL 7.0 (GLOVE) ×8 IMPLANT
GLOVE BIOGEL PI INDICATOR 7.0 (GLOVE) ×4
GOWN PREVENTION PLUS LG XLONG (DISPOSABLE) ×9 IMPLANT
GOWN STRL REIN XL XLG (GOWN DISPOSABLE) ×18 IMPLANT
HEMOSTAT SURGICEL 2X14 (HEMOSTASIS) ×3 IMPLANT
IV STOPCOCK 4 WAY 40  W/Y SET (IV SOLUTION) ×1
IV STOPCOCK 4 WAY 40 W/Y SET (IV SOLUTION) ×2 IMPLANT
KIT ACCESSORY DA VINCI DISP (KITS) ×1
KIT ACCESSORY DVNC DISP (KITS) ×2 IMPLANT
KIT DISP ACCESSORY 4 ARM (KITS) IMPLANT
NEEDLE HYPO 22GX1.5 SAFETY (NEEDLE) ×3 IMPLANT
NEEDLE INSUFFLATION 14GA 120MM (NEEDLE) ×3 IMPLANT
NS IRRIG 1000ML POUR BTL (IV SOLUTION) ×3 IMPLANT
OCCLUDER COLPOPNEUMO (BALLOONS) ×3 IMPLANT
PACK LAVH (CUSTOM PROCEDURE TRAY) ×3 IMPLANT
PAD PREP 24X48 CUFFED NSTRL (MISCELLANEOUS) ×6 IMPLANT
PLUG CATH AND CAP STER (CATHETERS) ×3 IMPLANT
PROTECTOR NERVE ULNAR (MISCELLANEOUS) ×6 IMPLANT
SET CYSTO W/LG BORE CLAMP LF (SET/KITS/TRAYS/PACK) IMPLANT
SET IRRIG TUBING LAPAROSCOPIC (IRRIGATION / IRRIGATOR) ×6 IMPLANT
SOLUTION ELECTROLUBE (MISCELLANEOUS) ×3 IMPLANT
SUT VIC AB 0 CT1 27 (SUTURE) ×2
SUT VIC AB 0 CT1 27XBRD ANBCTR (SUTURE) ×4 IMPLANT
SUT VIC AB 0 CT1 27XBRD ANTBC (SUTURE) IMPLANT
SUT VIC AB 2-0 CT1 27 (SUTURE)
SUT VIC AB 2-0 CT1 TAPERPNT 27 (SUTURE) IMPLANT
SUT VIC AB 4-0 PS2 27 (SUTURE) ×6 IMPLANT
SUT VICRYL 0 27 CT2 27 ABS (SUTURE) ×6 IMPLANT
SUT VICRYL 0 UR6 27IN ABS (SUTURE) ×6 IMPLANT
SUT VICRYL 4-0 PS2 18IN ABS (SUTURE) ×3 IMPLANT
SYR 50ML LL SCALE MARK (SYRINGE) ×3 IMPLANT
TIP UTERINE 6.7X8CM BLUE DISP (MISCELLANEOUS) ×3 IMPLANT
TOWEL OR 17X24 6PK STRL BLUE (TOWEL DISPOSABLE) ×9 IMPLANT
TROCAR 12M 150ML BLUNT (TROCAR) IMPLANT
TROCAR DISP BLADELESS 8 DVNC (TROCAR) ×2 IMPLANT
TROCAR DISP BLADELESS 8MM (TROCAR) ×1
TROCAR XCEL 12X100 BLDLESS (ENDOMECHANICALS) ×3 IMPLANT
TROCAR XCEL NON-BLD 11X100MML (ENDOMECHANICALS) ×3 IMPLANT
TROCAR XCEL NON-BLD 5MMX100MML (ENDOMECHANICALS) ×3 IMPLANT
TUBING FILTER THERMOFLATOR (ELECTROSURGICAL) ×6 IMPLANT
WARMER LAPAROSCOPE (MISCELLANEOUS) ×6 IMPLANT
WATER STERILE IRR 1000ML POUR (IV SOLUTION) ×9 IMPLANT

## 2012-05-21 NOTE — Brief Op Note (Signed)
05/21/2012  11:25 AM  PATIENT:  Michelle Kramer  46 y.o. female  PRE-OPERATIVE DIAGNOSIS:  Pelvic Pain and Heavy Periods Possible Adenomyosis  POST-OPERATIVE DIAGNOSIS:  Pelvic Pain and Heavy Periods. endometriosis  PROCEDURE:  Procedure(s) (LRB): diagnostic laparascopy LAPAROSCOPIC SUPRACERVICAL HYSTERECTOMY (N/A) BILATERAL SALPINGECTOMY (Bilateral) ROBOTIC ASSISTED TOTAL HYSTERECTOMY (N/A) Resection of endometriosis  SURGEON:  Surgeon(s) and Role:    * Nirvan Laban A. Ernestina Penna, MD - Primary Jacolyn Reedy, MD - Assisting  PHYSICIAN ASSISTANT:   ASSISTANTSCherly Hensen, MD    ANESTHESIA:   general  EBL:  Total I/O In: 1000 [I.V.:1000] Out: 250 [Urine:200; Blood:50]  BLOOD ADMINISTERED:none  DRAINS: Urinary Catheter (Foley)   LOCAL MEDICATIONS USED:  NONE  SPECIMEN:  Source of Specimen:  Uterus, bilateral fallopian tubes, peritoneal wall biopsy from posterior cul-de-sac  DISPOSITION OF SPECIMEN:  PATHOLOGY  COUNTS:  YES  TOURNIQUET:  * No tourniquets in log *  DICTATION: .Note written in EPIC  PLAN OF CARE: Admit for overnight observation  PATIENT DISPOSITION:  PACU - hemodynamically stable.   Delay start of Pharmacological VTE agent (>24hrs) due to surgical blood loss or risk of bleeding: yes

## 2012-05-21 NOTE — Op Note (Signed)
05/21/2012  11:25 AM  PATIENT:  Michelle Kramer  46 y.o. female  PRE-OPERATIVE DIAGNOSIS:  Pelvic Pain and Heavy Periods Possible Adenomyosis  POST-OPERATIVE DIAGNOSIS:  Pelvic Pain and Heavy Periods. endometriosis  PROCEDURE:  Procedure(s) (LRB): diagnostic laparascopy LAPAROSCOPIC SUPRACERVICAL HYSTERECTOMY (N/A) BILATERAL SALPINGECTOMY (Bilateral) ROBOTIC ASSISTED TOTAL HYSTERECTOMY (N/A) Resection of endometriosis  SURGEON:  Surgeon(s) and Role:    * Jory Tanguma A. Ernestina Penna, MD - Primary Jacolyn Reedy, MD - Assisting  PHYSICIAN ASSISTANT:   ASSISTANTSCherly Hensen, MD    ANESTHESIA:   general  EBL:  Total I/O In: 1000 [I.V.:1000] Out: 250 [Urine:200; Blood:50]  BLOOD ADMINISTERED:none  DRAINS: Urinary Catheter (Foley)   LOCAL MEDICATIONS USED:  NONE  SPECIMEN:  Source of Specimen:  Uterus, bilateral fallopian tubes, peritoneal wall biopsy from posterior cul-de-sac  DISPOSITION OF SPECIMEN:  PATHOLOGY  COUNTS:  YES  TOURNIQUET:  * No tourniquets in log *  DICTATION: .Note written in EPIC  PLAN OF CARE: Admit for overnight observation  PATIENT DISPOSITION:  PACU - hemodynamically stable.   Delay start of Pharmacological VTE agent (>24hrs) due to surgical blood loss or risk of bleeding: yes Complications: None Antibiotics: 2 g of Ancef Findings: 98 cc uterus, normal ovaries bilaterally, normal fallopian tubes bilaterally, right ovary surrounded by peritoneal scar, endometriosis in the posterior cul-de-sac, normal liver edge, normal appendix   Procedure: Diagnostic laparoscopy, Robotic-assisted supracervical laparoscopic hysterectomy, bilateral salpingectomy, resection of endometriosis  Indications: This is a 46 year old G2 P2 with complaints of menorrhagia, dysmenorrhea, dyspareunia, pelvic pain who opted for supracervical hysterectomy after a complete discussion of risks benefits and alternatives to the procedure. The patient asked for ovarian retention and to  keep her cervix for pelvic support  Procedure: After informed consent and discussion of alternatives to hysterectomy, the patient was taken to the operating room where general anesthesia was initiated without difficulty. She was prepped and draped in normal sterile fashion in the dorsal supine lithotomy position. A Foley catheter was inserted sterilely into the bladder. A bimanual examination was done to assess the size and position of the uterus. A tenaculum was placed on the cervix to allow some manipulation for the diagnostic laparoscopy portion .   Gloved were changed. Attention was then turned to the patient's abdomen. 0.5 % marcaine was used prior to all incision. A total of 20 cc of marcaine was used.  A 12 mm incision was made in the umbilicus and blunt and sharp dissection was done until the fascia was identified. This was then grasped with Kocher clamps x2 and entered sharply. A pursestring suture of 0 Vicryl was then placed along the incision and a non-bladed Roseanne Reno was inserted into the peritoneal cavity. Intraperitoneal placement was confirmed with the use of the camera and pneumoperitoneum was created to 15 mm of mercury. The pursestring suture was secured around the port and pneumoperitoneum was maintained. Brief survey of the abdomen and pelvis was done with findings as above. Given the endometriosis the decision was made for resection of endometriosis as the ovaries were being left in situ. At this time it was decided to proceed with robotic assistance for the remainder of the case.  A weighted speculum was placed in the vagina. A vaginal wall retractor was used on the anterior vagina and  the cervix was grasped with tenaculum. The cervix was sounded with the uterine sound. It sounded to 8 cm. The cervix was assessed to identify the Rumi-Co size.A medium cup and an 8 cm shaft was used. This was  easily threaded through the cervix, into the uterus and the cup seated nicely around the cervix. The  uterine balloon was inflated  The abdominal wall was assessed and additional port sites were marked. 8 mm incisions were placed in the right (t1)and left lower quadrants (1) and non-bladed ports were placed under direct visualization. An additional 5 mm incision was made in LLQ and 5mm non-bladed port was placed under direct visualization.  The robot was brought to the patient's side and attached with the right side docking. The robotic instruments were placed under direct visualization until proper placement just over the uterus.  I then went to the robotic console. Brief survey of the patient's abdomen and pelvis revealed  findings as above. Right ureter was identified and seen peristalsing well low in the pelvic sidewall. the left ureter was not seen. Bilateral tubes and ovaries were normal. There were significant adhesions around the right ovary and these were taken down sharply and bluntly. Hemostasis was noted.  I began on the right side: the  right  tube was serially cauterized with bipolar cautery with the use of the PK device and transected with the monopolar scissors. The  right utero-ovarian ligament was serially cauterized with the bipolar PK and transected with the monopolar scissors. The right lung round ligament was cauterized and transected.The anterior leaf of the broad ligament was dissected bluntly then monopolar cautery was used to separate the anterior and posterior leafs of the broad ligament. This was carried down through to the bladder flap. Attention was then turned to the patient's  left  side: the  left t tube was cauterized and transected,  the  left  uterine ovarian ligament was serially cauterized with the PK and transected with the monopolar scissors. The left  broad ligament was opened up the anterior and posterior leaves were dissected apart from each other. Monopolar cautery was used to come across the anterior leaf of the right broad ligament. This dissection was carried down  across to the midline to create the bladder flap. The right uterine artery was serially cauterized with the PK and transected with the monopolar scissors. The left uterine artery and then serially cauterized with the PK and transected with the monopolar scissors. The uterus was placed on stretch to allow better visualization of the arteries. The bladder flap was evaluated and and thought to be significantly remote from the area of dissection.  Once good blanching of the uterus was noted the monopolar scissors were used to transect the uterus from the cervix at the mid cervical level. Good hemostasis was noted.   At this point attention was turned to the posterior cul-de-sac. Endometriosis and scarring was noted of the posterior peritoneum in between the uterosacral ligaments and at the level of the uterosacral ligaments. The peritoneum was tented up and monopolar scissors were used to remove the peritoneum between the right and left uterosacral ligaments. Monopolar cautery was used for hemostasis.  Monopolar cautery was then used in the cervix to cauterize the endocervical cells.  Good hemostasis was noted and the robotic instruments were removed, the robot was undocked and we moved to standard laparoscopy.  By standard laparscopy,  the umbilicus port was changed to the morcellator. The #8 camera was placed through the left  lower quadrant and instruments were used to bring the uterus and cervix back into the pelvis. Morcellation was then carried out into the entire specimen was removed. All leftover pieces were also removed.  By standard laparoscopy the pelvis  was irrigated and evaluated. The patient was placed in reverse Trendelenburg, all additional fluid was suctioned from the abdomen and pelvis the cervical stump and peritoneal biopsy area was reinspected and  found to be hemostatic. All pedicles were also found to be hemostatic. A piece of Surgicel was placed along the peritoneal biopsy site. A piece  of Interceed was placed over the cervical stump.  Under direct visualization the ports were removed. Pneumoperitoneum was released and the umbilical port was removed.  The  pursestring suture at the umbilicus was then closed. No additional fascial incisions were closed due to the small size and the nondilated nature of these incisions. A 4-0 Vicryl was used to close the additional laparoscopic ports sites. Good hemostasis was noted.  Sponge lap and needle counts were correct x3 the patient was woken from general anesthesia having tolerated the procedure well and taken to the recovery room in a stable fashion.

## 2012-05-21 NOTE — Transfer of Care (Signed)
Immediate Anesthesia Transfer of Care Note  Patient: Sammantha G Riddle  Procedure(s) Performed: Procedure(s) (LRB): LAPAROSCOPIC SUPRACERVICAL HYSTERECTOMY (N/A) BILATERAL SALPINGECTOMY (Bilateral) ROBOTIC ASSISTED TOTAL HYSTERECTOMY (N/A)  Patient Location: PACU  Anesthesia Type: General  Level of Consciousness: awake, alert  and oriented  Airway & Oxygen Therapy: Patient Spontanous Breathing and Patient connected to nasal cannula oxygen  Post-op Assessment: Report given to PACU RN and Post -op Vital signs reviewed and stable  Post vital signs: Reviewed and stable  Complications: No apparent anesthesia complications

## 2012-05-21 NOTE — H&P (Signed)
See scanned docs for full H&P  Healthy 46 yo pt w/ long h/o abnl bleedng and pelvic pain. Also freq UTI and constipation. U/s suggestive of adenomyosis and pt opts for hysterectomy over other options. Pt would like ovarian retention due to age and prefers supracervical for pelvic support. No h/o abnl pap.  All: oxycodone sensitivity  PE: Filed Vitals:   05/21/12 0619  BP: 114/71  Pulse: 65  Temp: 98.2 F (36.8 C)  TempSrc: Oral  Resp: 18  SpO2: 100%   Gen: well appearing Abd: soft, NT, ND GU: 12 wk sized uterus  A/P: [facility], plan laprascopic approach, may convert to robotic  Michelle Kramer A. 05/21/2012 7:35 AM

## 2012-05-21 NOTE — Anesthesia Postprocedure Evaluation (Signed)
Anesthesia Post Note  Patient: Michelle Kramer  Procedure(s) Performed: Procedure(s) (LRB): LAPAROSCOPIC SUPRACERVICAL HYSTERECTOMY (N/A) BILATERAL SALPINGECTOMY (Bilateral) ROBOTIC ASSISTED TOTAL HYSTERECTOMY (N/A)  Anesthesia type: GA  Patient location: PACU  Post pain: Pain level controlled  Post assessment: Post-op Vital signs reviewed  Last Vitals:  Filed Vitals:   05/21/12 1332  BP: 116/54  Pulse: 61  Temp: 36.6 C  Resp: 16    Post vital signs: Reviewed  Level of consciousness: sedated  Complications: No apparent anesthesia complications

## 2012-05-21 NOTE — Progress Notes (Signed)
7 hrs post-op robotic [facility] w/ b/l salpingectomy  Pt w/ only 1 IV dose Dilaudid in post-op. Now getting oral med. Pt tol small amount po. Foley just out, no void yet. Pain controlled. Out of bed w/ assist  PE: Filed Vitals:   05/21/12 1315 05/21/12 1332 05/21/12 1430 05/21/12 1753  BP:  116/54 119/74 118/68  Pulse: 74 61 60 72  Temp:  97.8 F (36.6 C) 97.6 F (36.4 C) 97.8 F (36.6 C)  TempSrc:      Resp: 10 16 16 16   SpO2: 99% 100% 100% 97%   Gen: well appearing CV: RRR Pulm: CTAB Abd: soft, approp tender, ND Inc: C/D/I LE: SCDs in place, no edema  A/P: POD # 0 s/p [facility] - post-op recovering well, oral pain meds, awaiting void - Dispo. Pt thinking about going home tonight, will allow if meeting all criteria  Michelle Kramer A. 05/21/2012 6:43 PM

## 2012-05-21 NOTE — Addendum Note (Signed)
Addendum  created 05/21/12 1644 by Graciela Husbands, CRNA   Modules edited:Notes Section

## 2012-05-21 NOTE — Anesthesia Procedure Notes (Signed)
Procedure Name: Intubation Date/Time: 05/21/2012 7:56 AM Performed by: Graciela Husbands Pre-anesthesia Checklist: Suction available, Emergency Drugs available, Timeout performed, Patient identified and Patient being monitored Patient Re-evaluated:Patient Re-evaluated prior to inductionOxygen Delivery Method: Circle system utilized Preoxygenation: Pre-oxygenation with 100% oxygen Intubation Type: IV induction Ventilation: Mask ventilation without difficulty Laryngoscope Size: Mac and 3 Grade View: Grade I Tube size: 7.0 mm Number of attempts: 1 Airway Equipment and Method: Stylet Placement Confirmation: ETT inserted through vocal cords under direct vision,  positive ETCO2 and breath sounds checked- equal and bilateral Secured at: 20 cm Tube secured with: Tape Dental Injury: Teeth and Oropharynx as per pre-operative assessment

## 2012-05-21 NOTE — Anesthesia Postprocedure Evaluation (Signed)
  Anesthesia Post-op Note  Patient: Michelle Kramer  Procedure(s) Performed: Procedure(s) (LRB): LAPAROSCOPIC SUPRACERVICAL HYSTERECTOMY (N/A) BILATERAL SALPINGECTOMY (Bilateral) ROBOTIC ASSISTED TOTAL HYSTERECTOMY (N/A)  Patient Location: [facility] Unit  Anesthesia Type: General  Level of Consciousness: awake, alert  and oriented  Airway and Oxygen Therapy: Patient Spontanous Breathing and Patient connected to nasal cannula oxygen  Post-op Pain: mild  Post-op Assessment: Post-op Vital signs reviewed, Patient's Cardiovascular Status Stable and Pain level controlled  Post-op Vital Signs: Reviewed and stable  Complications: No apparent anesthesia complications

## 2012-05-22 ENCOUNTER — Encounter (HOSPITAL_COMMUNITY): Payer: Self-pay

## 2012-05-22 DIAGNOSIS — Z9889 Other specified postprocedural states: Secondary | ICD-10-CM

## 2012-05-22 HISTORY — DX: Other specified postprocedural states: Z98.890

## 2012-05-22 LAB — CBC
HCT: 30.9 % — ABNORMAL LOW (ref 36.0–46.0)
MCH: 28.2 pg (ref 26.0–34.0)
MCHC: 33.3 g/dL (ref 30.0–36.0)
MCV: 84.7 fL (ref 78.0–100.0)
RDW: 12.6 % (ref 11.5–15.5)

## 2012-05-22 LAB — URINALYSIS, ROUTINE W REFLEX MICROSCOPIC
Glucose, UA: NEGATIVE mg/dL
Leukocytes, UA: NEGATIVE
Nitrite: NEGATIVE
Specific Gravity, Urine: 1.02 (ref 1.005–1.030)
pH: 6 (ref 5.0–8.0)

## 2012-05-22 LAB — COMPREHENSIVE METABOLIC PANEL
Albumin: 3.4 g/dL — ABNORMAL LOW (ref 3.5–5.2)
BUN: 9 mg/dL (ref 6–23)
Calcium: 8.5 mg/dL (ref 8.4–10.5)
Creatinine, Ser: 0.73 mg/dL (ref 0.50–1.10)
GFR calc Af Amer: >90 mL/min (ref >90)
Potassium: 4.1 mEq/L (ref 3.5–5.1)
Total Protein: 5.7 g/dL — ABNORMAL LOW (ref 6.0–8.3)

## 2012-05-22 LAB — URINE MICROSCOPIC-ADD ON

## 2012-05-22 MED ORDER — SODIUM CHLORIDE 0.9 % IV BOLUS (SEPSIS)
1000.0000 mL | Freq: Once | INTRAVENOUS | Status: AC
  Administered 2012-05-22: 1000 mL via INTRAVENOUS

## 2012-05-22 MED ORDER — SIMETHICONE 80 MG PO CHEW
80.0000 mg | CHEWABLE_TABLET | Freq: Four times a day (QID) | ORAL | Status: DC | PRN
  Administered 2012-05-22: 80 mg via ORAL

## 2012-05-22 NOTE — Progress Notes (Signed)
Left msg for md regarding current lab results.

## 2012-05-22 NOTE — Progress Notes (Signed)
POD#1  Pt notes no flatus yet, no nausea/ emesis. "food just doesn't taste good." No BM. Pt notes pain controlled w/ po meds. "just some pressure when up and walking." No CP/ dizziness/ HA. Pt w/ poor Uout.  Since OR Uout 25-30cc/ hr, pt unable to void, cath has only been about 200-250cc x 2. 1000 cc bolus w/ only 100 cc void. Cr, BUN, Hgb wnl. Na low.   Repeat TOV pending.   PE: Filed Vitals:   05/21/12 2216 05/22/12 0131 05/22/12 0625 05/22/12 1005  BP: 119/70 104/44 108/67 114/71  Pulse: 53 59 60 62  Temp: 97.4 F (36.3 C) 98 F (36.7 C) 97.9 F (36.6 C) 98.2 F (36.8 C)  TempSrc: Oral Oral Oral Oral  Resp: 16 16 16 16   Height:      Weight:      SpO2: 100% 100% 100% 96%   Gen: well appearing, slightly anxious CV: RRR Pulm: CTAB Back: no CVAT Abd: approp tender, slightly distended, good bowel sounds GU: def LE: NT, no edema  A/P POD#1 s/p robotic assisted [facility] - post op- overall well with the exception of voiding. Unclear if poor urine output vs inability to void. Given low sodium, favor dehydration, awaiting repeat TOV and if inadequate will need catheter placed. Would then decided on another bolus (if low urine present) vs cystogram to assess for bladder injury vs IVP.   - Dilaudid po working well for pain.   Michelle Kramer A. 05/22/2012 1:47 PM

## 2012-05-22 NOTE — Discharge Summary (Signed)
Michelle Kramer MRN: [id number] DOB/AGE: 1966-01-06 46 y.o.  Admit date: 05/21/2012 Discharge date: 05/22/12  Admission Diagnoses: Pelvic Pain Heavy Periods Possible Adenomyosis  Discharge Diagnoses: Pelvic Pain Heavy Periods Possible Adenomyosis        Principal Problem:  *Post-operative state [facility] w/ bilat salpingectomy endometriosis Urinary retention  Discharged Condition: good  Hospital Course: Pt was admitted for diagnostic laparscopy and [facility] which converted to robotic assisted [facility] w/ b/l salpingectomy and resection of endometriosis. Pt had routine, uneventful surgery. By 7 hrs post-op she was tolerating liquids, pain was controlled on very low doses of oral pain meds, she had ambulated and the foley catheter was removed. Borderline low Uout was noted. Over the next several hours she had inability to void. Bladder scan revealed lower levels (200-300cc) of urine in bladder. Pt was still w/ stable vitals and w/o significant pain. Baseline fluids of 125 cc/hr were continued. Eventually, after 6 hrs of no void, an in and out catheterization was done for small amount of urine. Another 6 hrs and pt was still unable to void. Again I/O cath revealed 2-300 cc urine. At this point a CBC was nl and a CMP showed low sodium but otherwise normal values including Cr. A 1,000 cc bolus was given and over the the next 3 hrs pt was unable to void more than 100 cc. Foley catheter was placed and continued observation was done. A UA was normal with specific gravity of 1.020. Over the next few hours, Uout picked up to 50-100 cc/ hr. A voiding trial was then done, bladder backfilled with 400cc urine and pt was able to void >300cc. At this point, decision was made to d/c pt home with strict instruction to ensure void at least q 6hrs and to call the office tomorrow with the time and amount of her voids. Final diagnosis was initial dehydration (pt did have pre-op bowel prep) and subsequent urinary retention. At the time  of d/c pt was able to void adequately and her urine output was as expected. Otherwise she was doing well post-operatively and asking for d/c.   Consults: None  Treatments: IV hydration, robotic assisted [facility] with b/l salpingectomy and resection of endometriosis  Disposition: 01-Home or Self Care   Medication List  As of 05/22/2012  9:18 PM   STOP taking these medications         valACYclovir 500 MG tablet         TAKE these medications         CALCIUM-VITAMIN D PO   Take 2 tablets by mouth daily.      cephALEXin 250 MG capsule   Commonly known as: KEFLEX   Take 250 mg by mouth as needed. For recurrent UTI's      clonazePAM 0.5 MG tablet   Commonly known as: KLONOPIN   Take 0.25-0.5 mg by mouth at bedtime as needed. For sleep      HYDROmorphone 4 MG tablet   Commonly known as: DILAUDID   Take 0.5 tablets (2 mg total) by mouth every 2 (two) hours as needed (1-2 tabs q4 hrs as needed for pain).      ibuprofen 600 MG tablet   Commonly known as: ADVIL,MOTRIN   Take 1 tablet (600 mg total) by mouth every 6 (six) hours as needed (mild pain).             Signed: Lendon Colonel., MD 05/22/2012, 9:18 PM

## 2012-05-22 NOTE — Progress Notes (Signed)
Patient complains of gas unable to be expelled. Will get ortder for simethicone.  Patient has only voided 150 cc this am, will bolus pt per md 1L of fluid. Awaiting labs.

## 2012-10-08 ENCOUNTER — Other Ambulatory Visit: Payer: Self-pay | Admitting: Obstetrics

## 2012-10-08 DIAGNOSIS — R921 Mammographic calcification found on diagnostic imaging of breast: Secondary | ICD-10-CM

## 2012-10-16 ENCOUNTER — Ambulatory Visit
Admission: RE | Admit: 2012-10-16 | Discharge: 2012-10-16 | Disposition: A | Discharge status: Home / Self Care | Payer: 59 | Source: Ambulatory Visit | Attending: Obstetrics | Admitting: Obstetrics

## 2012-10-16 DIAGNOSIS — R921 Mammographic calcification found on diagnostic imaging of breast: Secondary | ICD-10-CM

## 2012-11-28 DIAGNOSIS — F4323 Adjustment disorder with mixed anxiety and depressed mood: Secondary | ICD-10-CM

## 2012-11-28 DIAGNOSIS — R413 Other amnesia: Secondary | ICD-10-CM

## 2012-12-03 DIAGNOSIS — F4323 Adjustment disorder with mixed anxiety and depressed mood: Secondary | ICD-10-CM

## 2012-12-03 DIAGNOSIS — R413 Other amnesia: Secondary | ICD-10-CM

## 2013-05-20 ENCOUNTER — Other Ambulatory Visit: Payer: Self-pay | Admitting: Obstetrics

## 2013-05-20 DIAGNOSIS — N6489 Other specified disorders of breast: Secondary | ICD-10-CM

## 2013-06-09 ENCOUNTER — Ambulatory Visit
Admission: RE | Admit: 2013-06-09 | Discharge: 2013-06-09 | Disposition: A | Discharge status: Home / Self Care | Payer: 59 | Source: Ambulatory Visit | Attending: Obstetrics | Admitting: Obstetrics

## 2013-06-09 DIAGNOSIS — N6489 Other specified disorders of breast: Secondary | ICD-10-CM

## 2013-10-16 ENCOUNTER — Encounter: Payer: Self-pay | Admitting: Medical

## 2013-10-16 ENCOUNTER — Ambulatory Visit (INDEPENDENT_AMBULATORY_CARE_PROVIDER_SITE_OTHER): Payer: 59 | Admitting: Medical

## 2013-10-16 VITALS — BP 110/80 | HR 62 | Temp 98.0°F | Resp 16 | Wt 110.0 lb

## 2013-10-16 DIAGNOSIS — M79609 Pain in unspecified limb: Secondary | ICD-10-CM

## 2013-10-16 DIAGNOSIS — F439 Reaction to severe stress, unspecified: Secondary | ICD-10-CM

## 2013-10-16 DIAGNOSIS — M79602 Pain in left arm: Secondary | ICD-10-CM

## 2013-10-16 DIAGNOSIS — G4700 Insomnia, unspecified: Secondary | ICD-10-CM

## 2013-10-16 DIAGNOSIS — Z733 Stress, not elsewhere classified: Secondary | ICD-10-CM

## 2013-10-16 MED ORDER — TRAZODONE HCL 50 MG PO TABS: 25.0000 mg | ORAL_TABLET | Freq: Every evening | ORAL | Status: DC | PRN

## 2013-10-16 NOTE — Progress Notes (Signed)
  Subjective:  Michelle Kramer is a 48 y.o. female who presents as a new patient today  She has concerns today about sleep, stress, left arm pain.    Recently been through stressful period at work.  Been having some aching of the left arm.   Resolved, but was achy for 2 wk.  Was a dull ache or throb. Felt weak at times.   She thought this could be related to her blood pressure. Checked her blood pressure at home and it was 157/97.   BP has been up and down through the years.   Was high on OCPs in the past.  For a period of couple years she was on blood pressure medicine in the past when she was on OCPs.  Denies chest pain, shortness of breath, swelling, palpitations, syncope.  She has problems with sleep for years.  Has used OTC medication including Benadryl, melatonin, NyQuil.  Has tried 1/2 Clonazepam periodically for sleep.   Generally goes to bed 10 pm, but wake time is variable given her poor sleep. She has problems mostly staying asleep, but sometimes problems getting to sleep as well.  Little noises awake her.  Consistent bed time during the week.  1 caffeine beveargee daily.  She has 4 children.  3 children live with them.  11yo, 16yo, 18yo.   Stressful job and life.   New job now, spent last 9 months trying to find a job.  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 Has a 2 hour commute daily. She does exercise some. Her fiance has used temazepam in the past and she tried some it worked well. He is asking for a prescription for temazepam.  No other aggravating or relieving factors.    No other c/o.  The following portions of the patient's history were reviewed and updated as appropriate: allergies, current medications, past family history, past medical history, past social history, past surgical history and problem list.  ROS Otherwise as in subjective above  Objective: Physical Exam  BP 110/80  Pulse 62  Temp(Src) 98 F  (36.7 C) (Oral)  Resp 16  Wt 110 lb (49.896 kg)   General appearance: alert, no distress, WD/WN, lean white female, underweight per BMI HEENT: normocephalic, sclerae anicteric, conjunctiva pink and moist, TMs pearly, nares patent, no discharge or erythema, pharynx normal Oral cavity: MMM, no lesions Neck: supple, no lymphadenopathy, no thyromegaly, no masses Heart: RRR, normal S1, S2, no murmurs Lungs: CTA bilaterally, no wheezes, rhonchi, or rales Abdomen: +bs, soft, non tender, non distended, no masses, no hepatomegaly, no splenomegaly MSK: left arm nontender throughout, normal range of motion, upper exam unremarkable Pulses: 2+ radial pulses, 2+ pedal pulses, normal cap refill Ext: no edema Neuro: CN II through XII intact, nonfocal exam Psych: Pleasant, answers questions appropriately   Assessment: Encounter Diagnoses  Name Primary?  . Insomnia Yes  . Left arm pain   . Stress      Plan: Insomnia-discussed sleep hygiene, discussed stress reduction, discussed medication options.  I recommend she use nighttime hot bath and period of meditation and reflection, sleepy time tea. Begin trial of trazodone.  I recommended against temazepam today.  Followup: One month  Left arm pain-resolved, discussed possible causes. Etiology unclear but exam normal today  Stress-discussed stress reduction, ways to deal with her symptoms and concerns  Follow up: One month

## 2013-10-16 NOTE — Patient Instructions (Signed)

## 2013-10-17 ENCOUNTER — Encounter: Payer: Self-pay | Admitting: Medical

## 2013-12-05 ENCOUNTER — Other Ambulatory Visit: Payer: Self-pay

## 2014-02-08 ENCOUNTER — Emergency Department (HOSPITAL_COMMUNITY)
Admission: EM | Admit: 2014-02-08 | Discharge: 2014-02-08 | Disposition: A | Discharge status: Home / Self Care | Payer: 59 | Source: Home / Self Care | Attending: Family Medicine | Admitting: Family Medicine

## 2014-02-08 ENCOUNTER — Encounter (HOSPITAL_COMMUNITY): Payer: Self-pay | Admitting: Emergency Medicine

## 2014-02-08 DIAGNOSIS — M2550 Pain in unspecified joint: Secondary | ICD-10-CM

## 2014-02-08 DIAGNOSIS — IMO0001 Reserved for inherently not codable concepts without codable children: Secondary | ICD-10-CM

## 2014-02-08 LAB — COMPREHENSIVE METABOLIC PANEL
ALBUMIN: 4.1 g/dL (ref 3.5–5.2)
ALT: 22 U/L (ref 0–35)
AST: 26 U/L (ref 0–37)
Alkaline Phosphatase: 51 U/L (ref 39–117)
BILIRUBIN TOTAL: 0.7 mg/dL (ref 0.3–1.2)
BUN: 20 mg/dL (ref 6–23)
CHLORIDE: 101 meq/L (ref 96–112)
CO2: 19 mEq/L (ref 19–32)
CREATININE: 0.82 mg/dL (ref 0.50–1.10)
Calcium: 9 mg/dL (ref 8.4–10.5)
GFR calc Af Amer: >90 mL/min (ref >90)
GFR, EST NON AFRICAN AMERICAN: 85 mL/min — AB (ref >90)
Glucose, Bld: 97 mg/dL (ref 70–99)
Potassium: 4.9 mEq/L (ref 3.7–5.3)
Sodium: 137 mEq/L (ref 137–147)
Total Protein: 7 g/dL (ref 6.0–8.3)

## 2014-02-08 LAB — CBC WITH DIFFERENTIAL/PLATELET
BASOS PCT: 0 % (ref 0–1)
Basophils Absolute: 0 10*3/uL (ref 0.0–0.1)
Eosinophils Absolute: 0.2 10*3/uL (ref 0.0–0.7)
Eosinophils Relative: 2 % (ref 0–5)
HEMATOCRIT: 41.2 % (ref 36.0–46.0)
HEMOGLOBIN: 14 g/dL (ref 12.0–15.0)
Lymphocytes Relative: 5 % — ABNORMAL LOW (ref 12–46)
Lymphs Abs: 0.4 10*3/uL — ABNORMAL LOW (ref 0.7–4.0)
MCH: 28.7 pg (ref 26.0–34.0)
MCHC: 34 g/dL (ref 30.0–36.0)
MCV: 84.6 fL (ref 78.0–100.0)
MONO ABS: 0.3 10*3/uL (ref 0.1–1.0)
MONOS PCT: 4 % (ref 3–12)
NEUTROS ABS: 7.7 10*3/uL (ref 1.7–7.7)
Neutrophils Relative %: 89 % — ABNORMAL HIGH (ref 43–77)
Platelets: 195 10*3/uL (ref 150–400)
RBC: 4.87 MIL/uL (ref 3.87–5.11)
RDW: 13.4 % (ref 11.5–15.5)
WBC: 8.7 10*3/uL (ref 4.0–10.5)

## 2014-02-08 LAB — SEDIMENTATION RATE: SED RATE: 11 mm/h (ref 0–22)

## 2014-02-08 LAB — RHEUMATOID FACTOR: Rhuematoid fact SerPl-aCnc: <10 IU/mL (ref <=14)

## 2014-02-08 MED ORDER — PREDNISONE 10 MG PO KIT: PACK | ORAL | Status: DC

## 2014-02-08 MED ORDER — DOXYCYCLINE HYCLATE 100 MG PO TABS
ORAL_TABLET | ORAL | Status: AC
  Filled 2014-02-08: qty 2

## 2014-02-08 MED ORDER — METHYLPREDNISOLONE SODIUM SUCC 125 MG IJ SOLR
125.0000 mg | Freq: Once | INTRAMUSCULAR | Status: AC
  Administered 2014-02-08: 125 mg via INTRAMUSCULAR

## 2014-02-08 MED ORDER — DOXYCYCLINE HYCLATE 100 MG PO TABS: 100.0000 mg | ORAL_TABLET | Freq: Once | ORAL | Status: DC

## 2014-02-08 MED ORDER — KETOROLAC TROMETHAMINE 60 MG/2ML IM SOLN
INTRAMUSCULAR | Status: AC
  Filled 2014-02-08: qty 2

## 2014-02-08 MED ORDER — KETOROLAC TROMETHAMINE 60 MG/2ML IM SOLN
60.0000 mg | Freq: Once | INTRAMUSCULAR | Status: AC
  Administered 2014-02-08: 60 mg via INTRAMUSCULAR

## 2014-02-08 MED ORDER — METHYLPREDNISOLONE SODIUM SUCC 125 MG IJ SOLR
INTRAMUSCULAR | Status: AC
  Filled 2014-02-08: qty 2

## 2014-02-08 MED ORDER — DOXYCYCLINE HYCLATE 100 MG PO CAPS: 100.0000 mg | ORAL_CAPSULE | Freq: Two times a day (BID) | ORAL | Status: DC

## 2014-02-08 MED ORDER — TRAMADOL HCL 50 MG PO TABS: 50.0000 mg | ORAL_TABLET | Freq: Four times a day (QID) | ORAL | Status: DC | PRN

## 2014-02-08 MED ORDER — DOXYCYCLINE HYCLATE 100 MG PO TABS
200.0000 mg | ORAL_TABLET | Freq: Once | ORAL | Status: AC
  Administered 2014-02-08: 200 mg via ORAL

## 2014-02-08 NOTE — ED Provider Notes (Addendum)
CSN: [id number]     Arrival date & time 02/08/14  0017 History   First MD Initiated Contact with Patient 02/08/14 [phone]     Chief Complaint  Patient presents with  . Joint Pain  . Poison Ivy   (Consider location/radiation/quality/duration/timing/severity/associated sxs/prior Treatment) HPI Comments: 48 year old female presents for evaluation of rash and joint pain. This initially began on Wednesday, 5 days ago. This began after she had been playing soccer in the yard with her son although this is not a change from her usual level of physical activity. That night, she started to have some pain in her hands but she did not think much of it. The next day her hands were very stiff and she had stiffness in her neck, shoulders, knees, hip, back, as well as a sore throat and some blisters under her bottom lip. This continued to worsen throughout the day and was bad that night. Late that night her throat was extremely sore and it was difficult to swallow. She took some ibuprofen and that seemed to help everything, but by the next morning her symptoms have returned. She has been taking ibuprofen since then which seems to help each time she takes it. Last night was the worst, she had severe stiffness and pain all over her body. She also has had diarrhea since this began, and some mild nausea without vomiting. On Wednesday when this began, she also had some very slight chest soreness but nothing since then. She has a history of frequent palpitations, she says she has mitral valve prolapse and the palpitations are related to this. She is still urinating normally. She has no abdominal pain and no more chest pain. She has no shortness of breath or cough. She has a rash on her left flank, she says she came into contact with poison ivy. She is adopted and does not know her family history, although she does say she has a half-sister with lupus. She denies any rash on her face. She has never had this happen before. She is  status post hysterectomy   Past Medical History  Diagnosis Date  . MVP (mitral valve prolapse)   . Premature ovarian failure     resolved 03/2011 with normal FSH/estradiol  . Chronic UTI   . Anxiety   . Post-operative state [facility] w/ bilat salpingectomy 05/22/2012  . Hypertension     only with prior OCP use  . Anemia   . Headache   . Insomnia    Past Surgical History  Procedure Laterality Date  . Bunionectomy      right foot  . Hysteroscopy  2002  . Laparoscopic supracervical hysterectomy  05/21/2012    Procedure: LAPAROSCOPIC SUPRACERVICAL HYSTERECTOMY;  Surgeon: Claiborne Billings A. Pamala Hurry, MD;  Location: [facility];  Service: Gynecology;  Laterality: N/A;   ~Laprascopic Supracervical Hysterectomy Attempted, Converted into a Robotic Assisted Hysterectomy  Laprascopic Supracervical Hysterectomy with Morcellation Possible Robotic Assistance Book in Robot Room Do Not Drape Robot    Family History  Problem Relation Age of Onset  . Adopted: Yes  . Hypertension Mother   . Lung cancer Mother   . Hypertension Maternal Grandmother   . Heart disease Maternal Grandmother    History  Substance Use Topics  . Smoking status: Former Research scientist (life sciences)  . Smokeless tobacco: Never Used  . Alcohol Use: Yes     Comment: occasional   OB History   Grav Para Term Preterm Abortions TAB SAB Ect Mult Living   _0 1  2     Review of Systems  Constitutional: Positive for fatigue. Negative for fever and chills.  HENT: Positive for sore throat. Negative for congestion, ear pain, postnasal drip, rhinorrhea and sinus pressure.   Eyes: Negative for pain and redness.  Respiratory: Negative for cough, chest tightness and shortness of breath.   Cardiovascular: Positive for chest pain and palpitations. Negative for leg swelling.  Gastrointestinal: Positive for diarrhea. Negative for nausea, vomiting, abdominal pain, constipation and blood in stool.  Endocrine: Negative for polydipsia and polyuria.  Genitourinary:  Negative for urgency, frequency, flank pain, vaginal bleeding and vaginal discharge.  Musculoskeletal: Positive for arthralgias, back pain, myalgias, neck pain and neck stiffness. Negative for joint swelling.  Skin: Positive for rash. Negative for color change.  Neurological: Positive for headaches. Negative for dizziness and weakness.  Hematological: Negative for adenopathy. Does not bruise/bleed easily.  Psychiatric/Behavioral: Negative for suicidal ideas and agitation. The patient is not nervous/anxious.   All other systems reviewed and are negative.   Allergies  Oxycodone-acetaminophen and Vicodin  Home Medications   Prior to Admission medications   Medication Sig Start Date End Date Taking? Authorizing Provider  CALCIUM-VITAMIN D PO Take 2 tablets by mouth daily.    Historical Provider, MD  estradiol (MINIVELLE) 0.05 MG/24HR patch Place 1 patch onto the skin once a week.    Historical Provider, MD  traZODone (DESYREL) 50 MG tablet Take 0.5-1 tablets (25-50 mg total) by mouth at bedtime as needed for sleep. 10/16/13   Camelia Eng Tysinger, PA-C   BP 130/87  Pulse 60  Temp(Src) 97 F (36.1 C) (Oral)  Resp 16  SpO2 97% Physical Exam  Nursing note and vitals reviewed. Constitutional: She is oriented to person, place, and time. Vital signs are normal. She appears well-developed and well-nourished. No distress.  HENT:  Head: Normocephalic and atraumatic.  Right Ear: External ear normal.  Left Ear: External ear normal.  Nose: Nose normal.  Mouth/Throat: Oropharynx is clear and moist. Oral lesions present. No oropharyngeal exudate.  Multiple small 44m ulcerated lesions inside the bottom lip.    Eyes: Right eye exhibits no discharge. Left eye exhibits no discharge. Right conjunctiva is injected. Left conjunctiva is injected.  Neck: Normal range of motion. Neck supple. No JVD present. No tracheal deviation present.  Cardiovascular: Normal rate, regular rhythm and normal heart sounds.   Exam reveals no gallop and no friction rub.   No murmur heard. Pulmonary/Chest: Effort normal and breath sounds normal. No respiratory distress. She has no wheezes. She has no rales.  Abdominal: She exhibits no distension and no mass. There is no tenderness. There is no rebound and no guarding.  Musculoskeletal:  Stiff through all ROM, joints are not swollen or tender.    Lymphadenopathy:    She has no cervical adenopathy.  Neurological: She is alert and oriented to person, place, and time. She has normal strength and normal reflexes. No cranial nerve deficit. Coordination normal.  Skin: Skin is warm and dry. Purpura (erythematous rash on the left flank in a linear distribution, and also on the inside of the left arm ) and rash noted. She is not diaphoretic.  Psychiatric: She has a normal mood and affect. Judgment normal.    ED Course  Procedures (including critical care time) Labs Review Labs Reviewed  CBC WITH DIFFERENTIAL - Abnormal; Notable for the following:    Neutrophils Relative % 89 (*)    Lymphocytes Relative 5 (*)    Lymphs Abs 0.4 (*)  All other components within normal limits  COMPREHENSIVE METABOLIC PANEL - Abnormal; Notable for the following:    GFR calc non Af Amer 85 (*)    All other components within normal limits  SEDIMENTATION RATE  ANA  B. BURGDORFI ANTIBODIES  ROCKY MTN SPOTTED FVR AB, IGG-BLOOD  ROCKY MTN SPOTTED FVR AB, IGM-BLOOD  RHEUMATOID FACTOR  EHRLICHIA ANTIBODY PANEL    Imaging Review No results found.   MDM   1. Polyarthralgia   2. Myalgia and myositis, unspecified    CBC and CMP are normal. Given first dose of doxycycline, Toradol, Solu-Medrol here. Will discharge with prednisone pack, doxycycline, tramadol. The rest of her labs are pending.  The differential includes autoimmune/rheumatologic form of symmetric polyarthritis, viral syndrome from something like CMV, tick born illness. Depending labs should help delineate this  further    Meds ordered this encounter  Medications  . ketorolac (TORADOL) injection 60 mg    Sig:   . methylPREDNISolone sodium succinate (SOLU-MEDROL) 125 mg/2 mL injection 125 mg    Sig:   . DISCONTD: doxycycline (VIBRA-TABS) tablet 100 mg    Sig:   . doxycycline (VIBRA-TABS) tablet 200 mg    Sig:   . doxycycline (VIBRAMYCIN) 100 MG capsule    Sig: Take 1 capsule (100 mg total) by mouth 2 (two) times daily.    Dispense:  20 capsule    Refill:  0    Order Specific Question:  Supervising Provider    Answer:  Lynne Leader, [place]  . PredniSONE 10 MG KIT    Sig: 12 day taper dose pack. Use as directed    Dispense:  48 each    Refill:  0    Order Specific Question:  Supervising Provider    Answer:  Lynne Leader, [place]  . traMADol (ULTRAM) 50 MG tablet    Sig: Take 1 tablet (50 mg total) by mouth every 6 (six) hours as needed.    Dispense:  15 tablet    Refill:  0    Order Specific Question:  Supervising Provider    Answer:  Lynne Leader, [place]       Liam Graham, PA-C 02/08/14 2022   Spoke With the patient on the phone, she has started feeling sicker again after initially getting better with doxycycline. I have discussed this with Dr. Johnnye Sima, infectious disease physician on call. He will see this patient in the clinic at the first available appointment which is 3 weeks out. He said that would be fine, but if the patient starts to get a fever, she should call and they will see her immediately. He has also asked [place] to check HIV and RPR, she will come back in today to have those drawn.  Liam Graham, PA-C 02/17/14 1348

## 2014-02-08 NOTE — Discharge Instructions (Signed)
You may take Tylenol in addition to these prescriptions as needed for pain  So far your lab tests look okay. If there are any abnormal results that come back today I will call you. If abnormal results come back later in the week you will receive a call from another staff member. Please set up an appointment with your primary care physician later this week for followup.     Arthralgia Your caregiver has diagnosed you as suffering from an arthralgia. Arthralgia means there is pain in a joint. This can come from many reasons including:  Bruising the joint which causes soreness (inflammation) in the joint.  Wear and tear on the joints which occur as we grow older (osteoarthritis).  Overusing the joint.  Various forms of arthritis.  Infections of the joint. Regardless of the cause of pain in your joint, most of these different pains respond to anti-inflammatory drugs and rest. The exception to this is when a joint is infected, and these cases are treated with antibiotics, if it is a bacterial infection. HOME CARE INSTRUCTIONS   Rest the injured area for as long as directed by your caregiver. Then slowly start using the joint as directed by your caregiver and as the pain allows. Crutches as directed may be useful if the ankles, knees or hips are involved. If the knee was splinted or casted, continue use and care as directed. If an stretchy or elastic wrapping bandage has been applied today, it should be removed and re-applied every 3 to 4 hours. It should not be applied tightly, but firmly enough to keep swelling down. Watch toes and feet for swelling, bluish discoloration, coldness, numbness or excessive pain. If any of these problems (symptoms) occur, remove the ace bandage and re-apply more loosely. If these symptoms persist, contact your caregiver or return to this location.  For the first 24 hours, keep the injured extremity elevated on pillows while lying down.  Apply ice for 15-20 minutes  to the sore joint every couple hours while awake for the first half day. Then 03-04 times per day for the first 48 hours. Put the ice in a plastic bag and place a towel between the bag of ice and your skin.  Wear any splinting, casting, elastic bandage applications, or slings as instructed.  Only take over-the-counter or prescription medicines for pain, discomfort, or fever as directed by your caregiver. Do not use aspirin immediately after the injury unless instructed by your physician. Aspirin can cause increased bleeding and bruising of the tissues.  If you were given crutches, continue to use them as instructed and do not resume weight bearing on the sore joint until instructed. Persistent pain and inability to use the sore joint as directed for more than 2 to 3 days are warning signs indicating that you should see a caregiver for a follow-up visit as soon as possible. Initially, a hairline fracture (break in bone) may not be evident on X-rays. Persistent pain and swelling indicate that further evaluation, non-weight bearing or use of the joint (use of crutches or slings as instructed), or further X-rays are indicated. X-rays may sometimes not show a small fracture until a week or 10 days later. Make a follow-up appointment with your own caregiver or one to whom we have referred you. A radiologist (specialist in reading X-rays) may read your X-rays. Make sure you know how you are to obtain your X-ray results. Do not assume everything is normal if you do not hear from us. SEEK  MEDICAL CARE IF: Bruising, swelling, or pain increases. SEEK IMMEDIATE MEDICAL CARE IF:   Your fingers or toes are numb or blue.  The pain is not responding to medications and continues to stay the same or get worse.  The pain in your joint becomes severe.  You develop a fever over 102 F (38.9 C).  It becomes impossible to move or use the joint. MAKE SURE YOU:   Understand these instructions.  Will watch your  condition.  Will get help right away if you are not doing well or get worse. Document Released: 09/11/2005 Document Revised: 12/04/2011 Document Reviewed: 04/29/2008 [facility] Patient Information 2014 [place], [place].

## 2014-02-08 NOTE — ED Notes (Addendum)
[facility], Lyme disease IgG and IgM, Rheumatoid factor and Ehrlichiosis

## 2014-02-08 NOTE — ED Notes (Signed)
Patient c/o joint pain onset Wednesday. Started in her  Hands and arms, and now is in her legs and stiffness in her neck. Patient reports pain kept her from sleeping last night. Also c/o poison ivy rash on left side of her abdomen and left arm. Patient is alert and oriented and in no acute distress.

## 2014-02-08 NOTE — ED Notes (Signed)
[phone] best phone number, ok to leave results on mssg

## 2014-02-09 LAB — B. BURGDORFI ANTIBODIES: B burgdorferi Ab IgG+IgM: 0.56 {ISR}

## 2014-02-09 LAB — ANA: ANA: NEGATIVE

## 2014-02-10 ENCOUNTER — Telehealth (HOSPITAL_COMMUNITY): Payer: Self-pay | Admitting: *Deleted

## 2014-02-10 LAB — ROCKY MTN SPOTTED FVR AB, IGG-BLOOD: RMSF IgG: 0.4 IV

## 2014-02-10 LAB — ROCKY MTN SPOTTED FVR AB, IGM-BLOOD: RMSF IGM: 2.45 IV — AB (ref 0.00–0.89)

## 2014-02-10 NOTE — ED Notes (Signed)
Lab called with pos. RMSF IgM result-2.45 HH. ( Rest of labs neg. RMSF IgG 0.40 neg., Lyme B. burdorfi 0.56 neg., Rheumatoid factor < 10 neg., ANA neg., sed rate 11 normal, Ehrlichia pending.)  Lab reported to Dr. Lorenz CoasterKeller.  He said pt. adequately treated with Doxycycline, notify pt. and it is reportable to the Health Dept.  I called pt. Pt. verified x 2 and given results.  Pt. told she is adequately treated and finish all of her antibioitic.  She asked about the rash and Prednisone.  I told her the rash could be related to the RMSF.  She said it was itching. I said I did not think RMSF rash itched.  Pt. told to notify her PCP, and ask him about finishing the Prednisone. DHHS form completed [facility]. Desiree LucySuzanne M Ahaana Rochette 02/10/2014

## 2014-02-11 LAB — EHRLICHIA ANTIBODY PANEL
E chaffeensis (HGE) Ab, IgG: <1:64 {titer}
E chaffeensis (HGE) Ab, IgM: <1:20 {titer}

## 2014-02-11 NOTE — ED Provider Notes (Signed)
Medical screening examination/treatment/procedure(s) were performed by a resident physician or non-physician practitioner and as the supervising physician I was immediately available for consultation/collaboration.  Evan Corey, MD    Evan S Corey, MD 02/11/14 0747 

## 2014-02-18 NOTE — ED Provider Notes (Signed)
Medical screening examination/treatment/procedure(s) were performed by a resident physician or non-physician practitioner and as the supervising physician I was immediately available for consultation/collaboration.  Jasan Doughtie, MD    Omero Kowal S Timoth Schara, MD 02/18/14 0746 

## 2014-02-19 ENCOUNTER — Ambulatory Visit: Payer: 59 | Admitting: Cardiovascular Disease

## 2014-02-26 ENCOUNTER — Ambulatory Visit (INDEPENDENT_AMBULATORY_CARE_PROVIDER_SITE_OTHER): Payer: 59 | Admitting: Internal Medicine

## 2014-02-26 ENCOUNTER — Encounter: Payer: Self-pay | Admitting: Internal Medicine

## 2014-02-26 VITALS — BP 144/85 | HR 62 | Temp 98.1°F | Ht 65.0 in | Wt 110.0 lb

## 2014-02-26 DIAGNOSIS — R509 Fever, unspecified: Secondary | ICD-10-CM | POA: Insufficient documentation

## 2014-02-26 DIAGNOSIS — K121 Other forms of stomatitis: Secondary | ICD-10-CM

## 2014-02-26 DIAGNOSIS — L237 Allergic contact dermatitis due to plants, except food: Secondary | ICD-10-CM

## 2014-02-26 DIAGNOSIS — IMO0001 Reserved for inherently not codable concepts without codable children: Secondary | ICD-10-CM

## 2014-02-26 DIAGNOSIS — K1370 Unspecified lesions of oral mucosa: Secondary | ICD-10-CM

## 2014-02-26 DIAGNOSIS — M791 Myalgia, unspecified site: Secondary | ICD-10-CM

## 2014-02-26 DIAGNOSIS — L255 Unspecified contact dermatitis due to plants, except food: Secondary | ICD-10-CM

## 2014-02-26 LAB — CBC
HEMATOCRIT: 36.3 % (ref 36.0–46.0)
Hemoglobin: 12.3 g/dL (ref 12.0–15.0)
MCH: 28 pg (ref 26.0–34.0)
MCHC: 33.9 g/dL (ref 30.0–36.0)
MCV: 82.5 fL (ref 78.0–100.0)
Platelets: 356 10*3/uL (ref 150–400)
RBC: 4.4 MIL/uL (ref 3.87–5.11)
RDW: 13.7 % (ref 11.5–15.5)
WBC: 14.5 10*3/uL — AB (ref 4.0–10.5)

## 2014-02-27 ENCOUNTER — Encounter: Payer: Self-pay | Admitting: Internal Medicine

## 2014-02-27 DIAGNOSIS — L237 Allergic contact dermatitis due to plants, except food: Secondary | ICD-10-CM | POA: Insufficient documentation

## 2014-02-27 DIAGNOSIS — M791 Myalgia, unspecified site: Secondary | ICD-10-CM | POA: Insufficient documentation

## 2014-02-27 DIAGNOSIS — K121 Other forms of stomatitis: Secondary | ICD-10-CM | POA: Insufficient documentation

## 2014-02-27 LAB — COMPREHENSIVE METABOLIC PANEL
ALT: 14 U/L (ref 0–35)
AST: 13 U/L (ref 0–37)
Albumin: 4.3 g/dL (ref 3.5–5.2)
Alkaline Phosphatase: 47 U/L (ref 39–117)
BUN: 19 mg/dL (ref 6–23)
CALCIUM: 9.8 mg/dL (ref 8.4–10.5)
CHLORIDE: 101 meq/L (ref 96–112)
CO2: 28 mEq/L (ref 19–32)
Creat: 0.83 mg/dL (ref 0.50–1.10)
Glucose, Bld: 73 mg/dL (ref 70–99)
POTASSIUM: 4.3 meq/L (ref 3.5–5.3)
Sodium: 138 mEq/L (ref 135–145)
Total Bilirubin: 0.6 mg/dL (ref 0.2–1.2)
Total Protein: 7.1 g/dL (ref 6.0–8.3)

## 2014-02-27 LAB — HIV ANTIBODY (ROUTINE TESTING W REFLEX): HIV 1&2 Ab, 4th Generation: NONREACTIVE

## 2014-02-27 LAB — RPR

## 2014-02-27 NOTE — Progress Notes (Signed)
Patient ID: Michelle Kramer, female   DOB: [birth year], 48 y.o.   MRN: [id number]         [facility] for Infectious Disease  Reason for Consult: Fevers and myalgia Referring Physician: Dr. Lynne Leader  Patient Active Problem List   Diagnosis Date Noted  . Myalgia 02/27/2014    Priority: High  . Oral ulceration 02/27/2014    Priority: High  . Fever 02/26/2014    Priority: High  . Poison ivy dermatitis 02/27/2014    Priority: Medium  . Post-operative state [facility] w/ bilat salpingectomy 05/22/2012    Patient's Medications  New Prescriptions   No medications on file  Previous Medications   CALCIUM-VITAMIN D PO    Take 2 tablets by mouth daily.   CLONAZEPAM (KLONOPIN) 0.5 MG TABLET    Take 0.5 mg by mouth at bedtime as needed for anxiety.   ESTRADIOL (MINIVELLE) 0.05 MG/24HR PATCH    Place 1 patch onto the skin once a week.   PREDNISONE 10 MG KIT    12 day taper dose pack. Use as directed   TRAMADOL (ULTRAM) 50 MG TABLET    Take 1 tablet (50 mg total) by mouth every 6 (six) hours as needed.   TRAZODONE (DESYREL) 50 MG TABLET    Take 0.5-1 tablets (25-50 mg total) by mouth at bedtime as needed for sleep.  Modified Medications   No medications on file  Discontinued Medications   DOXYCYCLINE (VIBRAMYCIN) 100 MG CAPSULE    Take 1 capsule (100 mg total) by mouth 2 (two) times daily.    Recommendations: 1. Complete 6 more days of prednisone 2. Repeat CBC and complete metabolic panel 3. Check HIV antibody and RPR 4. Followup in 2 weeks   Assessment: I am not absolutely certain what caused her acute febrile illness associated with myalgias and oral blisters. I do not believe it was [facility] spotted fever. Her clinical illness is atypical for [facility] spotted fever and it would be very unlikely that she would develop detectable IgM antibody after only 3 days of illness. I believe the positive IgM antibody is a false positive. I suspect that she did have an acute,  self-limited viral illness that is resolving. Her only laboratory abnormality was a mild lymphopenia. HIV antibody was ordered at the [facility] but the test was not done. She does not believe she has ever been tested for HIV and requests testing because her former husband is a "drug addict". I will check lab work today. She will complete the last few days of her prednisone for poison ivy and followup with me in 2 weeks.  HPI: Michelle Kramer is a 48 y.o. female who works as a Government social research officer at [employer]. She states that she has a history of fluctuating blood pressures in the past. She also had a brief episode of some throbbing pain in her left arm last fall that resolved spontaneously. Otherwise she feels she has been in very good health recently. On May 14 she was outside playing soccer with one of her sons. The following morning she noted that the right side of her jaw was hurting when she woke up. Over the next 2 days she developed progressive achiness and on May 16 she woke early in the morning with severe pain. She states that it "felt like my arms were broken." She had worse right jaw pain and could not turn her head because of the pain. She felt quite  hot and had shaking chills. She noted some nausea. She recalls that initially she could not even move to reach for her heating pad or wake her husband. After a few minutes she was able to wake him and he brought her to the [facility]. She recalls being told that she had fever upon arrival although the [facility] note although her a note indicating a temperature of 97. She recalls having some sore throat and 2-3 small blisters inside her lower lip.  Around that same time she had a pruritic rash on her left flank. She states that she feels quite certain this was unrelated to her acute illness. She believes it was due to poison ivy which he has had multiple times in the past.  She has no known tick  exposures or unusual exposure to other animals. She is married and has a blended family with 2 children of her own and 2 children of her husband. Her 58 year old son who recently had some diarrhea and headache that resolved spontaneously.  She was treated with doxycycline and prednisone. Her fever and chills resolved fairly promptly and her pain improved after several days. She decided to stop taking the prednisone after several days but then restarted it recently when she noted her poison ivy rash is still bothering her. She has completed her course of doxycycline.  She continues to be bothered by fatigue and anorexia. She has had some mild aching pain in her left arm. She has been worried that maybe she had something other than [facility] spotted fever. She has been concerned that she might have had meningitis although she did not have any headache. She has also had some concerns about having multiple sclerosis. She mentions that she's also wondered about whether she had lupus as she has a sister who suffers from lupus.  Review of Systems: Constitutional: positive for anorexia, chills, fatigue and fevers, negative for sweats and weight loss Eyes: negative Ears, nose, mouth, throat, and face: positive for voice change and she feels she is losing her voice Respiratory: negative Cardiovascular: negative Gastrointestinal: negative Genitourinary:negative Integument/breast: positive for pruritus and rash Musculoskeletal:positive for arthralgias and myalgias, negative for back pain, muscle weakness, neck pain and stiff joints    Past Medical History  Diagnosis Date  . MVP (mitral valve prolapse)   . Premature ovarian failure     resolved 03/2011 with normal FSH/estradiol  . Chronic UTI   . Anxiety   . Post-operative state [facility] w/ bilat salpingectomy 05/22/2012  . Hypertension     only with prior OCP use  . Anemia   . Headache   . Insomnia     History  Substance Use Topics  .  Smoking status: Former Research scientist (life sciences)  . Smokeless tobacco: Never Used  . Alcohol Use: Yes     Comment: occasional    Family History  Problem Relation Age of Onset  . Adopted: Yes  . Hypertension Mother   . Lung cancer Mother   . Hypertension Maternal Grandmother   . Heart disease Maternal Grandmother    Allergies  Allergen Reactions  . Oxycodone-Acetaminophen Nausea And Vomiting    Extreme nausea & vomiting  . Vicodin [Hydrocodone-Acetaminophen] Nausea And Vomiting    Extreme nausea & vomiting    OBJECTIVE: Blood pressure 144/85, pulse 62, temperature 98.1 F (36.7 C), temperature source Oral, height _0  (1.651 m), weight 110 lb (49.896 kg), last menstrual period 05/09/2012.  General: She is alert and in  no distress. Her voice sounds weak and slightly shaky Skin: A few red papules on left flank Lymph nodes: No palpable adenopathy Eyes: Normal external exam Oral: No oropharyngeal lesions Lungs: Clear Cor: Regular S1-S2 no murmurs Abdomen: Soft and nontender with no palpable masses Joints and extremities: No abnormalities noted Neuro: Alert with normal speech and conversation. Normal strength in all extremities Mood affect: Concerned about normal  Lab Results  Component Value Date   WBC 14.5* 02/26/2014   HGB 12.3 02/26/2014   HCT 36.3 02/26/2014   MCV 82.5 02/26/2014   PLT 356 02/26/2014   CMP     Component Value Date/Time   NA 138 02/26/2014 1711   K 4.3 02/26/2014 1711   CL 101 02/26/2014 1711   CO2 28 02/26/2014 1711   GLUCOSE 73 02/26/2014 1711   BUN 19 02/26/2014 1711   CREATININE 0.83 02/26/2014 1711   CREATININE 0.82 02/08/2014 1030   CALCIUM 9.8 02/26/2014 1711   PROT 7.1 02/26/2014 1711   ALBUMIN 4.3 02/26/2014 1711   AST 13 02/26/2014 1711   ALT 14 02/26/2014 1711   ALKPHOS 47 02/26/2014 1711   BILITOT 0.6 02/26/2014 1711   GFRNONAA 85* 02/08/2014 1030   GFRAA >90 02/08/2014 1030   Sed Rate (mm/hr)  Date Value  02/08/2014 11    ANA 02/08/2014: Negative Rheumatoid factor 02/08/2014:  Less than 10  [place] spotted fever IgM 02/08/2014: 2.45 (H) [place] spotted fever IgG 02/08/2014: 0.4 (normal) Borellia burgdorferi IgM + IgG 02/08/2014: 5.00 (normal) Erlichia IgM 93/81/8299: Less than 3:71 Erlichia IgG 69/67/8938: Less than 1:64  Michel Bickers, MD [facility] for Infectious [place] [facility] [phone] pager   [phone] cell 02/27/2014, 3:09 PM

## 2014-03-04 ENCOUNTER — Telehealth: Payer: Self-pay | Admitting: *Deleted

## 2014-03-04 NOTE — Telephone Encounter (Signed)
Shared that all lab work with the exception of WBC of 14.5 were normal.  Pt verbalized understanding.  Pt does have return appt which she confirmed.

## 2014-03-05 ENCOUNTER — Telehealth: Payer: Self-pay | Admitting: Cardiovascular Disease

## 2014-03-05 NOTE — Telephone Encounter (Signed)
Office notified via fax that patient cancelled appointment with Dr. Allyson Sabal on 02/19/14

## 2014-03-10 ENCOUNTER — Ambulatory Visit: Payer: 59 | Admitting: Infectious Diseases

## 2014-03-12 ENCOUNTER — Ambulatory Visit (INDEPENDENT_AMBULATORY_CARE_PROVIDER_SITE_OTHER): Payer: 59 | Admitting: Internal Medicine

## 2014-03-12 ENCOUNTER — Encounter: Payer: Self-pay | Admitting: Internal Medicine

## 2014-03-12 VITALS — BP 137/90 | HR 72 | Temp 98.2°F | Ht 64.0 in | Wt 110.8 lb

## 2014-03-12 DIAGNOSIS — J069 Acute upper respiratory infection, unspecified: Secondary | ICD-10-CM | POA: Insufficient documentation

## 2014-03-12 NOTE — Progress Notes (Signed)
Patient ID: Michelle Kramer, female   DOB: [birth year], 48 y.o.   MRN: [id number]         [facility]  Patient Active Problem List   Diagnosis Date Noted  . Acute upper respiratory infection 03/12/2014    Priority: High  . Poison ivy dermatitis 02/27/2014    Priority: Medium  . Myalgia 02/27/2014    Priority: Medium  . Oral ulceration 02/27/2014    Priority: Medium  . Fever 02/26/2014    Priority: Medium  . Post-operative state [facility] w/ bilat salpingectomy 05/22/2012    Patient's Medications  New Prescriptions   No medications on file  Previous Medications   CALCIUM-VITAMIN D PO    Take 2 tablets by mouth daily.   CLONAZEPAM (KLONOPIN) 0.5 MG TABLET    Take 0.5 mg by mouth at bedtime as needed for anxiety.   DIETARY MANAGEMENT PRODUCT (GABADONE PO)    Take by mouth.   ESTRADIOL (MINIVELLE) 0.05 MG/24HR PATCH    Place 1 patch onto the skin once a week.   LINACLOTIDE (LINZESS) 145 MCG CAPS CAPSULE    Take 145 mcg by mouth as needed.   TRAMADOL (ULTRAM) 50 MG TABLET    Take 1 tablet (50 mg total) by mouth every 6 (six) hours as needed.   TRAZODONE (DESYREL) 50 MG TABLET    Take 0.5-1 tablets (25-50 mg total) by mouth at bedtime as needed for sleep.  Modified Medications   No medications on file  Discontinued Medications   PREDNISONE 10 MG KIT    12 day taper dose pack. Use as directed    Subjective: Ms. Michelle Kramer is in for her followup visit. After her visit 2 weeks ago she was feeling better. Her fever, myalgias and oral blisters have resolved and she was beginning to feel stronger. However her son developed an ear infection and shortly thereafter she began to develop some sore throat, nonproductive cough, ringing in her ears, and headache. She also noted some aching in her hands with occasional tingling in her fingertips. She has not had any fever, chills, sweats, myalgias or recurrent oral lesions. She has been treating her symptoms with ibuprofen and  cough drops with some relief.  Review of Systems: Pertinent items are noted in HPI.  Past Medical History  Diagnosis Date  . MVP (mitral valve prolapse)   . Premature ovarian failure     resolved 03/2011 with normal FSH/estradiol  . Chronic UTI   . Anxiety   . Post-operative state [facility] w/ bilat salpingectomy 05/22/2012  . Hypertension     only with prior OCP use  . Anemia   . Headache   . Insomnia     History  Substance Use Topics  . Smoking status: Former Research scientist (life sciences)  . Smokeless tobacco: Never Used  . Alcohol Use: Yes     Comment: occasional    Family History  Problem Relation Age of Onset  . Adopted: Yes  . Hypertension Mother   . Lung cancer Mother   . Hypertension Maternal Grandmother   . Heart disease Maternal Grandmother     Allergies  Allergen Reactions  . Oxycodone-Acetaminophen Nausea And Vomiting    Extreme nausea & vomiting  . Vicodin [Hydrocodone-Acetaminophen] Nausea And Vomiting    Extreme nausea & vomiting    Objective: Temp: 98.2 F (36.8 C) (06/18 1615) Temp src: Oral (06/18 1615) BP: 137/90 mmHg (06/18 1621) Pulse Rate: 72 (06/18 1615)  General: She is in no  distress. She is a slightly scratchy, quiet voice Skin: No rash, her left wrist poison ivy has resolved Ears: A little bit of cerumen bilaterally without other abnormalities Oral: Mild pharyngeal redness without other oropharyngeal lesions Lungs: Clear Cor: Regular S1-S2 no murmurs Abdomen: Soft and nontender Joints and extremities: Normal  Lab Results HIV antibody: Nonreactive RPR: Nonreactive   Assessment: I suspect that she now has an acute upper respiratory infection following closely after her recent febrile illness. I recommend continuing symptomatic therapy with followup in 2 weeks.  Plan: 1. Symptomatic treatment 2. Followup in 2 weeks   Michel Bickers, MD [facility] [place] [phone] pager   [phone] cell 03/12/2014, 4:54  PM

## 2014-03-23 ENCOUNTER — Telehealth: Payer: Self-pay | Admitting: *Deleted

## 2014-03-23 NOTE — Telephone Encounter (Signed)
Patient called to cancel her upcoming appointment, states that she is feeling much better.  She states she had 1 episode of a bad sore throat/earache with a tongue spasm the weekend after seeing Dr. Orvan Falconerampbell and then 1 eye infection the week after that, but has remained in good health since.  She will call for an appointment PRN. Andree CossHowell, Michelle M, RN

## 2014-03-26 ENCOUNTER — Ambulatory Visit: Payer: 59 | Admitting: Internal Medicine

## 2014-07-27 ENCOUNTER — Encounter: Payer: Self-pay | Admitting: Internal Medicine

## 2014-09-06 ENCOUNTER — Ambulatory Visit (INDEPENDENT_AMBULATORY_CARE_PROVIDER_SITE_OTHER): Payer: 59 | Admitting: Emergency Medicine

## 2014-09-06 ENCOUNTER — Ambulatory Visit (INDEPENDENT_AMBULATORY_CARE_PROVIDER_SITE_OTHER): Payer: 59

## 2014-09-06 VITALS — BP 120/77 | HR 57 | Temp 97.7°F | Resp 16 | Ht 64.0 in | Wt 109.1 lb

## 2014-09-06 DIAGNOSIS — R29898 Other symptoms and signs involving the musculoskeletal system: Secondary | ICD-10-CM

## 2014-09-06 DIAGNOSIS — R9431 Abnormal electrocardiogram [ECG] [EKG]: Secondary | ICD-10-CM

## 2014-09-06 IMAGING — CR DG CERVICAL SPINE 2 OR 3 VIEWS
2 series · 2 of 2 positions shown · non-contrast
Comparison: None.

CLINICAL DATA: Left arm weakness and numbness. There is also left
arm pain. Symptoms for 1 year.

EXAM:
CERVICAL SPINE - 2-3 VIEW

[lateral]
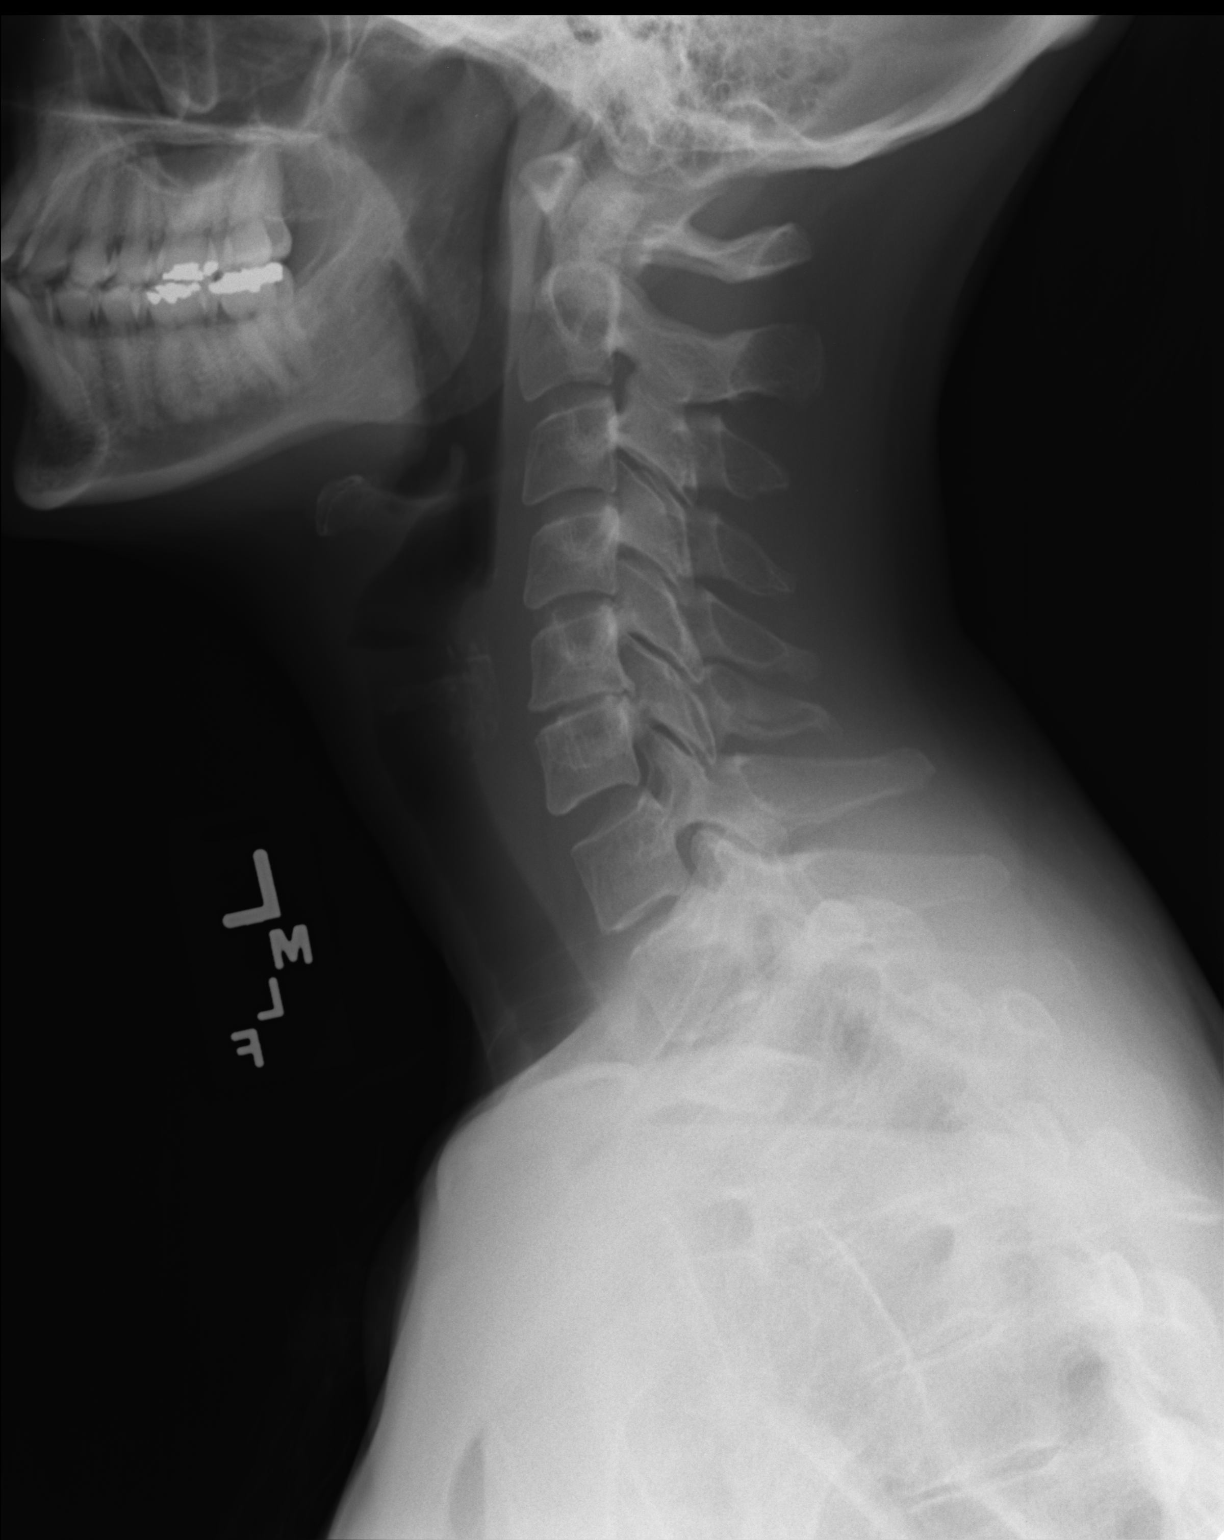

[AP]
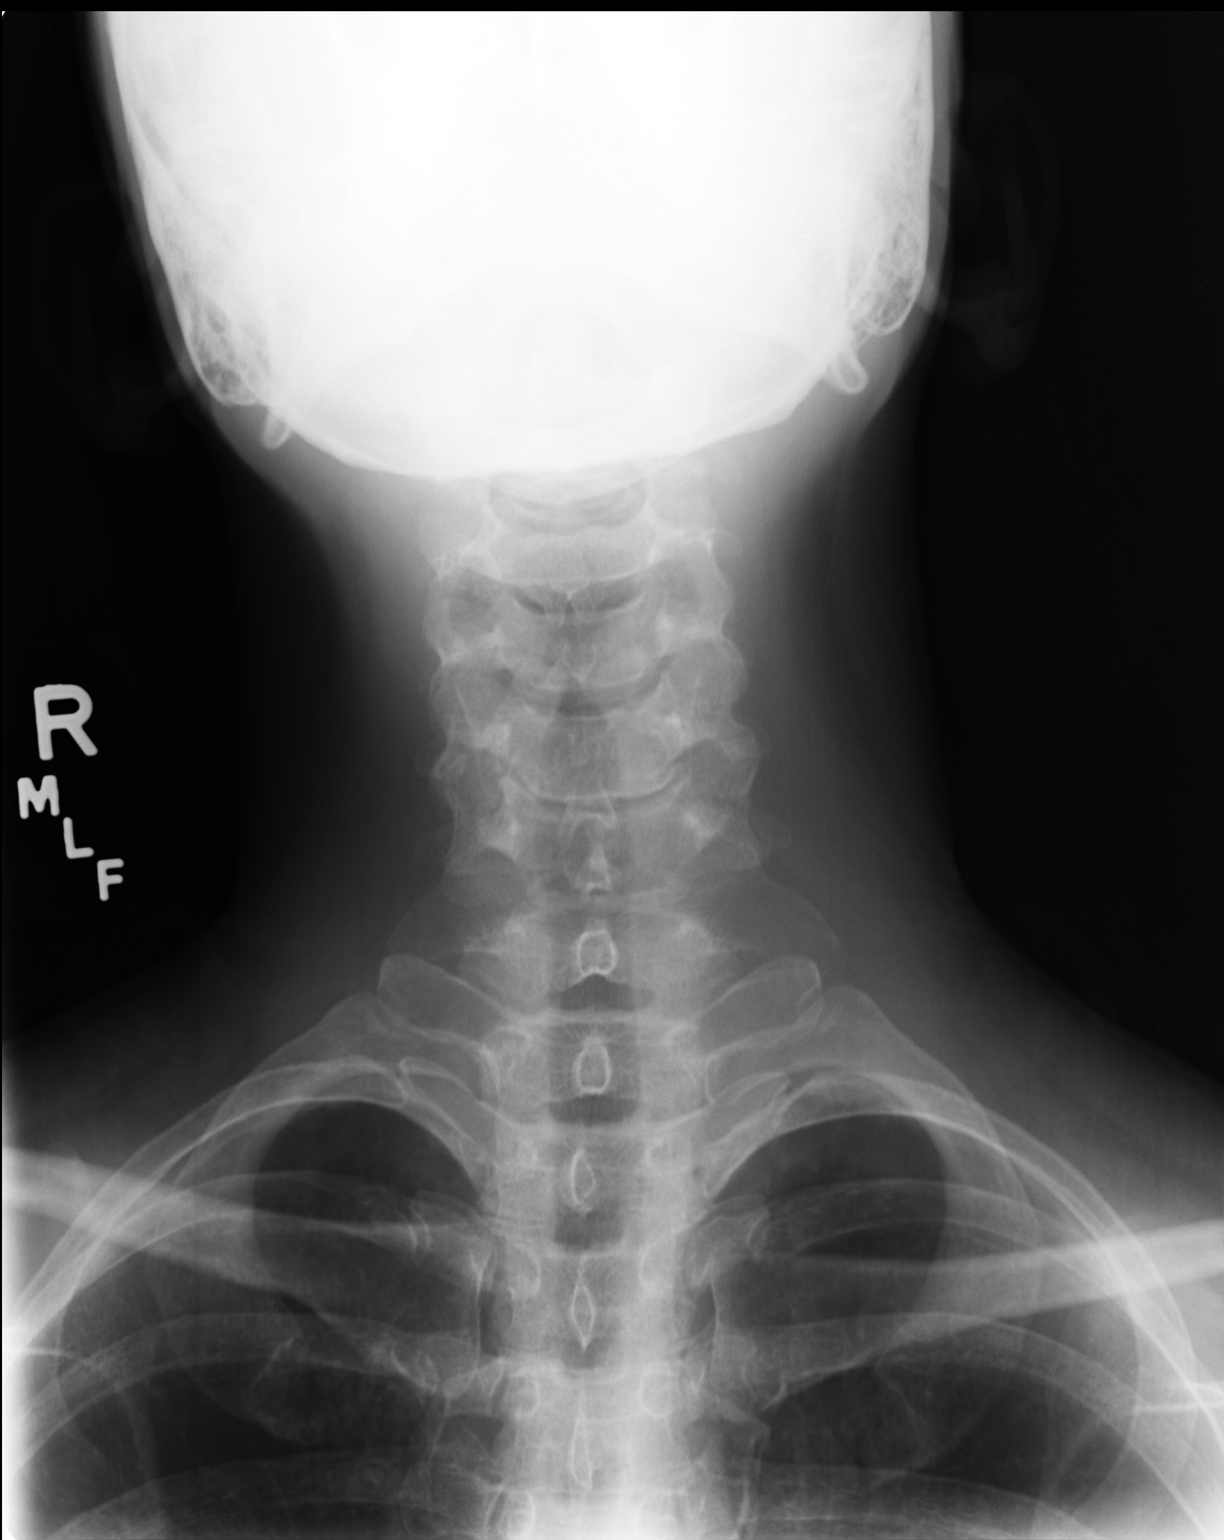

[2 of 2 positions shown; findings below may reference images not displayed]

FINDINGS: No fracture.  No spondylolisthesis.

Moderate loss of disc height with small endplate spurs at C5-C6.
Remaining discs are relatively well preserved in height. No other
degenerative change.

Normal soft tissues.
IMPRESSION: No fracture or acute finding.  Disc degenerative changes at C5-C6.

## 2014-09-06 IMAGING — CR DG CHEST 2V
2 series · 2 of 2 positions shown · non-contrast
Comparison: None.

CLINICAL DATA: Left arm weakness with numbness and pain for 1 year.
Ex-smoker.

EXAM:
CHEST  2 VIEW

[PA]
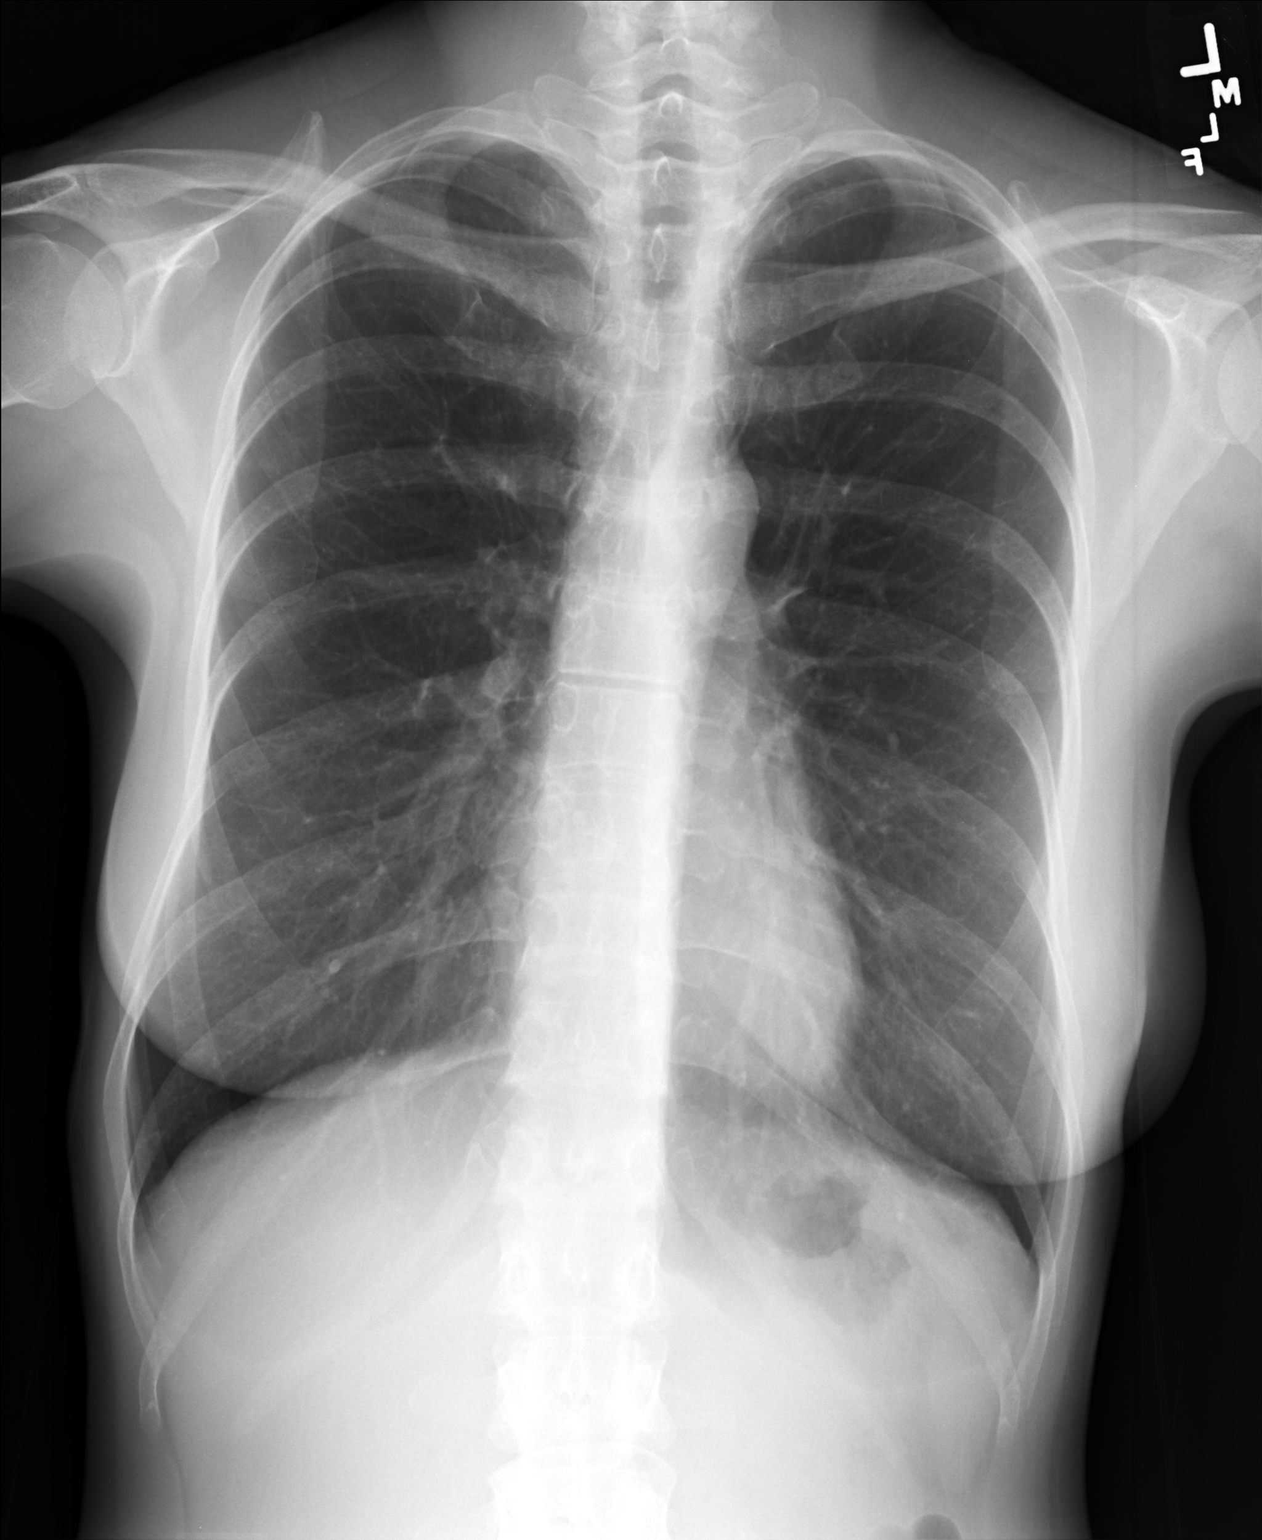

[lateral]
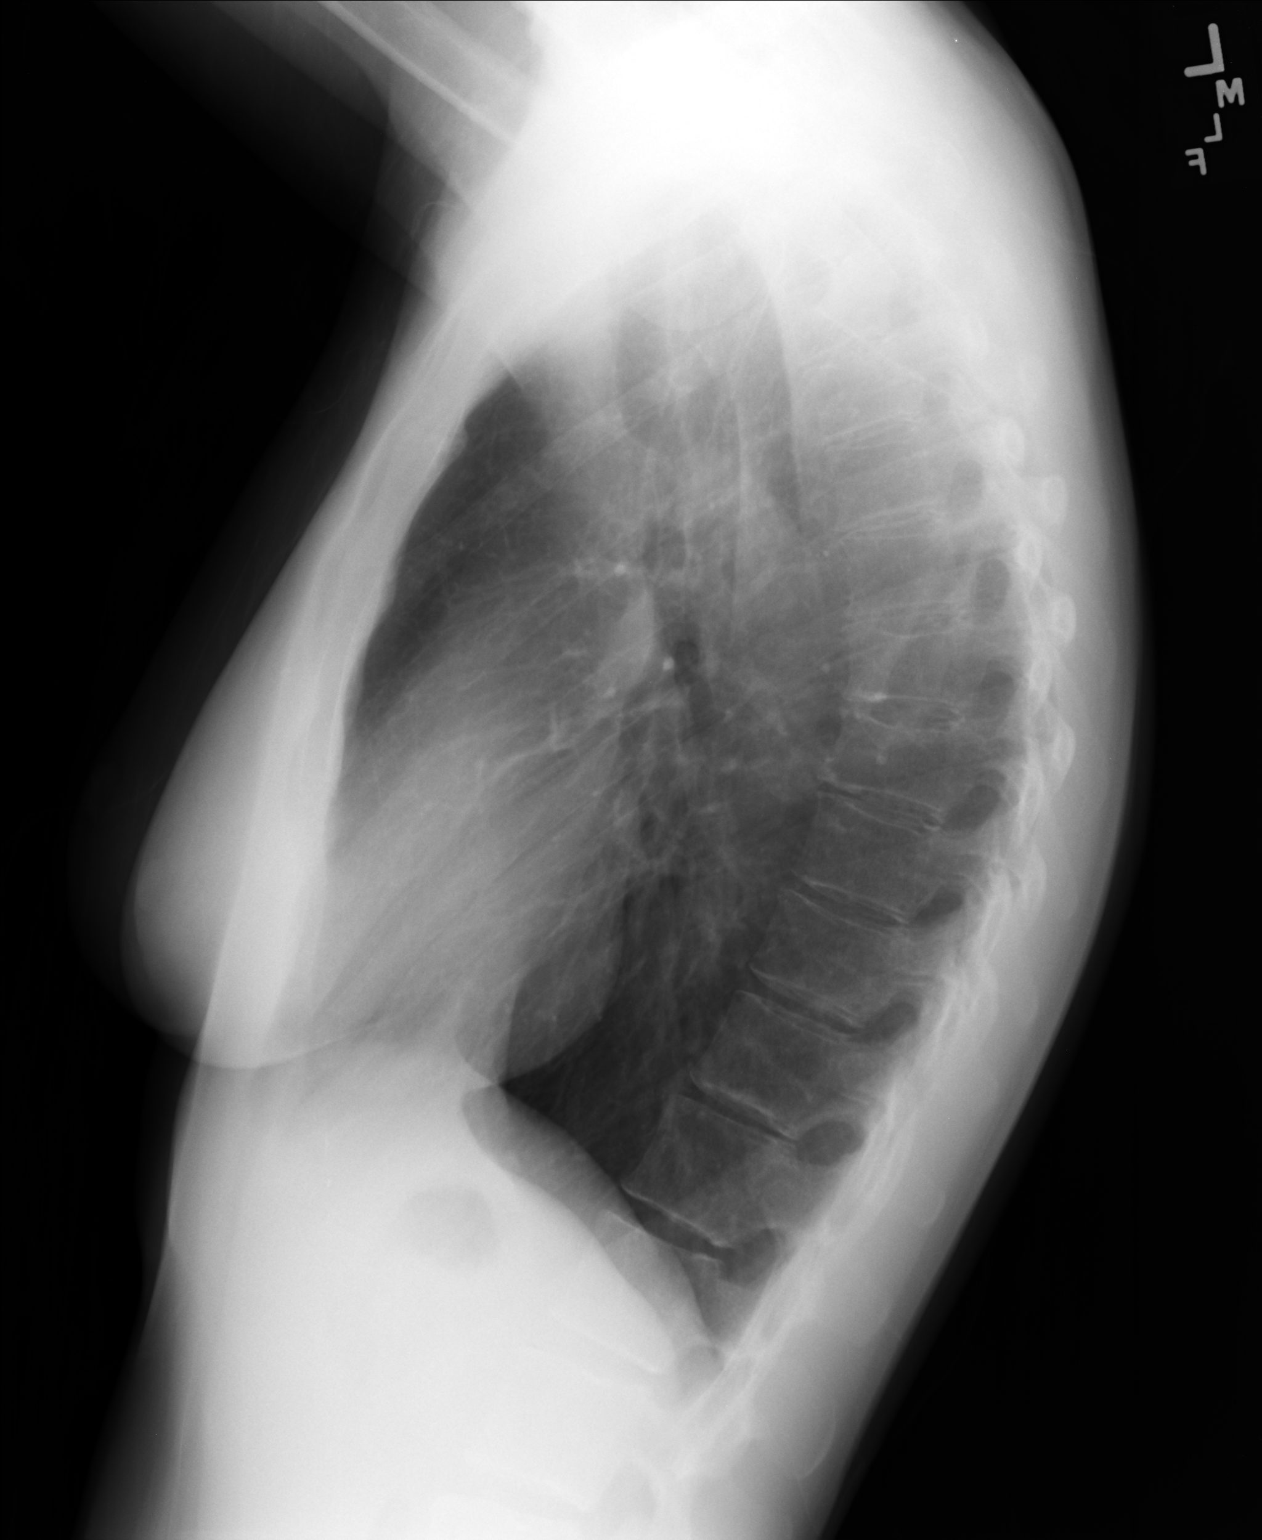

[2 of 2 positions shown; findings below may reference images not displayed]

FINDINGS: The heart size and mediastinal contours are normal. The lungs appear
mildly hyperinflated but clear. There is no pleural effusion or
pneumothorax. No acute osseous findings are evident.
IMPRESSION: No active cardiopulmonary process. Suspected mild obstructive lung
disease.

## 2014-09-06 NOTE — Patient Instructions (Signed)

## 2014-09-06 NOTE — Progress Notes (Signed)
Subjective:  This chart was scribed for Lesle ChrisSteven Dorann Davidson, MD by Elveria Risingimelie Horne, Medial Scribe. This patient was seen in room 4 and the patient's care was started at 3:04 PM.  Chief Complaint  Patient presents with  . Numbness    Left Arm with Warmness Sensation  . Arm Pain    Left     Patient ID: Colan Neptunemy G Riddle, female    DOB: 1965/11/02, 48 y.o.   MRN: [id number]  HPI HPI Comments: Sharlena Julius BowelsG Riddle is a [age] y.o. female who presents to the [facility] complaining of intermittent left arm aching, weakness and tingling in her hand, which has been ongoing for one year now. Patient likens her pain to muscle fatigue. Patient reports noticeable worsening these past few months. Patient reports that she called her PCP three days ago and was advised to come [facility] to receive an E[facility] so that she can then be referred to a neurologist.  Patient reports that she assumed the arm pain was stress related. Patient denies chest pain on exertion or exacerbated arm pain with neck movement. Patient reports occasional exercise and denies increased arm pain with activity. Patient reports strange episode a few months where she awakened to severe jaw pain and bilateral arm weakness. Patient was evaluated for just hours later. She was suspiciously diagnosed with [facility] and prescribed Azithromycin; though patient denies known tick bite. Patient reports re evaluation by infectious disease specialist who found no abnormalities or need for additional testing. Patient was informed that her best bet was to be watchful for similar symptoms/episdoes.   Patient Active Problem List   Diagnosis Date Noted  . Acute upper respiratory infection 03/12/2014  . Poison ivy dermatitis 02/27/2014  . Myalgia 02/27/2014  . Oral ulceration 02/27/2014  . Fever 02/26/2014  . Post-operative state [facility] w/ bilat salpingectomy 05/22/2012   Past Medical History  Diagnosis Date  . MVP (mitral valve  prolapse)   . Premature ovarian failure     resolved 03/2011 with normal FSH/estradiol  . Chronic UTI   . Anxiety   . Post-operative state [facility] w/ bilat salpingectomy 05/22/2012  . Hypertension     only with prior OCP use  . Anemia   . Headache   . Insomnia   . Heart murmur    Past Surgical History  Procedure Laterality Date  . Bunionectomy      right foot  . Hysteroscopy  2002  . Laparoscopic supracervical hysterectomy  05/21/2012    Procedure: LAPAROSCOPIC SUPRACERVICAL HYSTERECTOMY;  Surgeon: Tresa EndoKelly A. Ernestina PennaFogleman, MD;  Location: [facility];  Service: Gynecology;  Laterality: N/A;   ~Laprascopic Supracervical Hysterectomy Attempted, Converted into a Robotic Assisted Hysterectomy  Laprascopic Supracervical Hysterectomy with Morcellation Possible Robotic Assistance Book in Robot Room Do Not Drape Robot   . Abdominal hysterectomy     Allergies  Allergen Reactions  . Oxycodone-Acetaminophen Nausea And Vomiting    Extreme nausea & vomiting  . Vicodin [Hydrocodone-Acetaminophen] Nausea And Vomiting    Extreme nausea & vomiting   Prior to Admission medications   Medication Sig Start Date End Date Taking? Authorizing Provider  CALCIUM-VITAMIN D PO Take 2 tablets by mouth daily.   Yes Historical Provider, MD  clonazePAM (KLONOPIN) 0.5 MG tablet Take 0.5 mg by mouth at bedtime as needed for anxiety.   Yes Historical Provider, MD  Dietary Management Product (GABADONE PO) Take by mouth.   Yes Historical Provider, MD  estradiol (MINIVELLE) 0.05  MG/24HR patch Place 1 patch onto the skin once a week.   Yes Historical Provider, MD  Linaclotide Karlene Einstein(LINZESS) 145 MCG CAPS capsule Take 145 mcg by mouth as needed.   Yes Historical Provider, MD  traMADol (ULTRAM) 50 MG tablet Take 1 tablet (50 mg total) by mouth every 6 (six) hours as needed. Patient not taking: Reported on 09/06/2014 02/08/14   Graylon GoodZachary H Baker, PA-C  traZODone (DESYREL) 50 MG tablet Take 0.5-1 tablets (25-50 mg total) by mouth at  bedtime as needed for sleep. Patient not taking: Reported on 09/06/2014 10/16/13   Jac Canavanavid S Tysinger, PA-C   History   Social History  . Marital Status: Single    Spouse Name: N/A    Number of Children: N/A  . Years of Education: N/A   Occupational History  . Not on file.   Social History Main Topics  . Smoking status: Former Games developermoker  . Smokeless tobacco: Never Used  . Alcohol Use: Yes     Comment: occasional  . Drug Use: No  . Sexual Activity: Yes    Birth Control/ Protection: Surgical     Comment: vasectomy   Other Topics Concern  . Not on file   Social History Narrative   Lives with fiance of 6 years, 3 children at home, 2 biological, 1 step, [employer], exercises some    Review of Systems  Constitutional: Negative for fever and chills.  Respiratory: Negative for shortness of breath.   Cardiovascular: Negative for chest pain.  Musculoskeletal: Positive for myalgias and arthralgias. Negative for neck pain and neck stiffness.  Neurological: Positive for weakness and numbness.       Objective:   Physical Exam  Nursing note and vitals reviewed.   CONSTITUTIONAL: Well developed/well nourished HEAD: Normocephalic/atraumatic EYES: EOMI/PERRL ENMT: Mucous membranes moist NECK: supple no meningeal signs SPINE/BACK:entire spine nontender CV: S1/S2 noted, no murmurs/rubs/gallops noted LUNGS: Lungs are clear to auscultation bilaterally, no apparent distress ABDOMEN: soft, nontender, no rebound or guarding, bowel sounds noted throughout abdomen GU:no cva tenderness NEURO: Pt is awake/alert/appropriate, moves all extremitiesx4.  No facial droop.   EXTREMITIES: pulses normal/equal, full ROM; motor strength 5/5 SKIN: warm, color normal PSYCH: no abnormalities of mood noted, alert and oriented to situation  Filed Vitals:   09/06/14 1440  BP: 120/77  Pulse: 57  Temp: 97.7 F (36.5 C)  TempSrc: Oral  Resp: 16  Height: 5\' 4"  (1.626 m)  Weight: 109 lb 2 oz (49.499 kg)    SpO2: 100%     [facility] reading (PRIMARY) by  Dr.Omarion Minnehan there is C5-C6 degenerative disc disease chest x-ray shows no acute disease.     Assessment & Plan:  I suspect her arm symptoms are secondary to degenerative disc disease at C5-C6. Will follow-up with neurological evaluation and cardiac evaluation. Her symptoms are most consistent with a cervical radiculopathy.  I personally performed the services described in this documentation, which was scribed in my presence. The recorded information has been reviewed and is accurate.

## 2014-09-08 ENCOUNTER — Telehealth: Payer: Self-pay | Admitting: Radiology

## 2014-09-08 NOTE — Telephone Encounter (Signed)
[facility], she indicates she is waiting for Dr Jacinto HalimGanji office to call patient and schedule appointment. Dr Cleta Albertsaub would like patient seen this week or next week. Lily did not want to call his office and check on this, so I have called the office and spoken to AronaDonna. Patient is scheduled for Dec 22nd with Dr Jacinto HalimGanji. To you FYI

## 2014-09-10 ENCOUNTER — Other Ambulatory Visit: Payer: Self-pay | Admitting: Neurology

## 2014-09-10 DIAGNOSIS — R29898 Other symptoms and signs involving the musculoskeletal system: Secondary | ICD-10-CM

## 2014-09-12 ENCOUNTER — Other Ambulatory Visit: Payer: 59

## 2014-10-22 ENCOUNTER — Ambulatory Visit: Payer: 59 | Admitting: Neurology

## 2014-10-22 ENCOUNTER — Telehealth: Payer: Self-pay | Admitting: Neurology

## 2014-10-22 NOTE — Telephone Encounter (Signed)
Pt cancelled NP appt w/ our office, originally scheduled for today. Per pt she was able to get in sooner elsewhere / [facility].

## 2015-02-04 ENCOUNTER — Other Ambulatory Visit: Payer: Self-pay | Admitting: Obstetrics

## 2015-02-04 DIAGNOSIS — N6489 Other specified disorders of breast: Secondary | ICD-10-CM

## 2015-02-12 ENCOUNTER — Ambulatory Visit
Admission: RE | Admit: 2015-02-12 | Discharge: 2015-02-12 | Disposition: A | Discharge status: Home / Self Care | Payer: No Typology Code available for payment source | Source: Ambulatory Visit | Attending: Obstetrics | Admitting: Obstetrics

## 2015-02-12 ENCOUNTER — Other Ambulatory Visit: Payer: 59

## 2015-02-12 DIAGNOSIS — N6489 Other specified disorders of breast: Secondary | ICD-10-CM

## 2015-10-01 ENCOUNTER — Encounter: Payer: Self-pay | Admitting: Gastroenterology

## 2015-10-01 ENCOUNTER — Encounter: Payer: Self-pay | Admitting: Internal Medicine

## 2016-07-04 ENCOUNTER — Other Ambulatory Visit: Payer: Self-pay | Admitting: Obstetrics

## 2016-07-04 DIAGNOSIS — Z1231 Encounter for screening mammogram for malignant neoplasm of breast: Secondary | ICD-10-CM

## 2016-07-21 ENCOUNTER — Ambulatory Visit
Admission: RE | Admit: 2016-07-21 | Discharge: 2016-07-21 | Disposition: A | Discharge status: Home / Self Care | Payer: 59 | Source: Ambulatory Visit | Attending: Obstetrics | Admitting: Obstetrics

## 2016-07-21 DIAGNOSIS — Z1231 Encounter for screening mammogram for malignant neoplasm of breast: Secondary | ICD-10-CM

## 2019-03-04 ENCOUNTER — Other Ambulatory Visit: Payer: Self-pay | Admitting: Medical

## 2019-03-04 DIAGNOSIS — Z1231 Encounter for screening mammogram for malignant neoplasm of breast: Secondary | ICD-10-CM

## 2019-03-13 ENCOUNTER — Other Ambulatory Visit: Payer: Self-pay

## 2019-03-13 ENCOUNTER — Ambulatory Visit
Admission: RE | Admit: 2019-03-13 | Discharge: 2019-03-13 | Disposition: A | Discharge status: Home / Self Care | Payer: PRIVATE HEALTH INSURANCE | Source: Ambulatory Visit | Attending: Medical | Admitting: Medical

## 2019-03-13 ENCOUNTER — Other Ambulatory Visit: Payer: Self-pay | Admitting: Obstetrics & Gynecology

## 2019-03-13 DIAGNOSIS — Z1231 Encounter for screening mammogram for malignant neoplasm of breast: Secondary | ICD-10-CM

## 2019-03-13 IMAGING — MG DIGITAL SCREENING BILATERAL MAMMOGRAM WITH TOMO AND CAD
8 series · 9 of 24 positions shown · non-contrast
Comparison: Previous exam(s).

CLINICAL DATA: Screening.

EXAM:
DIGITAL SCREENING BILATERAL MAMMOGRAM WITH TOMO AND CAD

[L CC synth-2D]
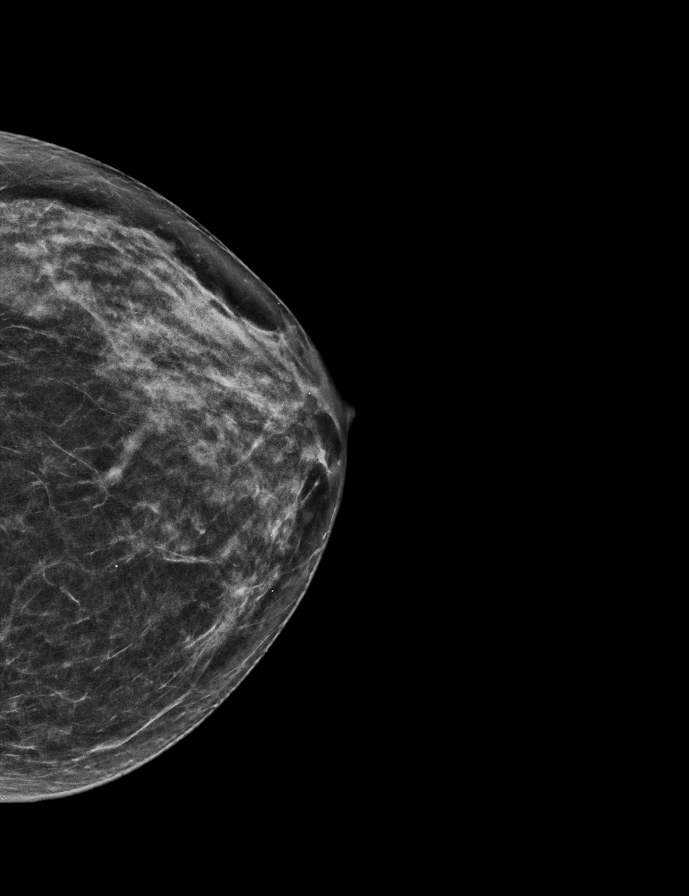

[L MLO synth-2D]
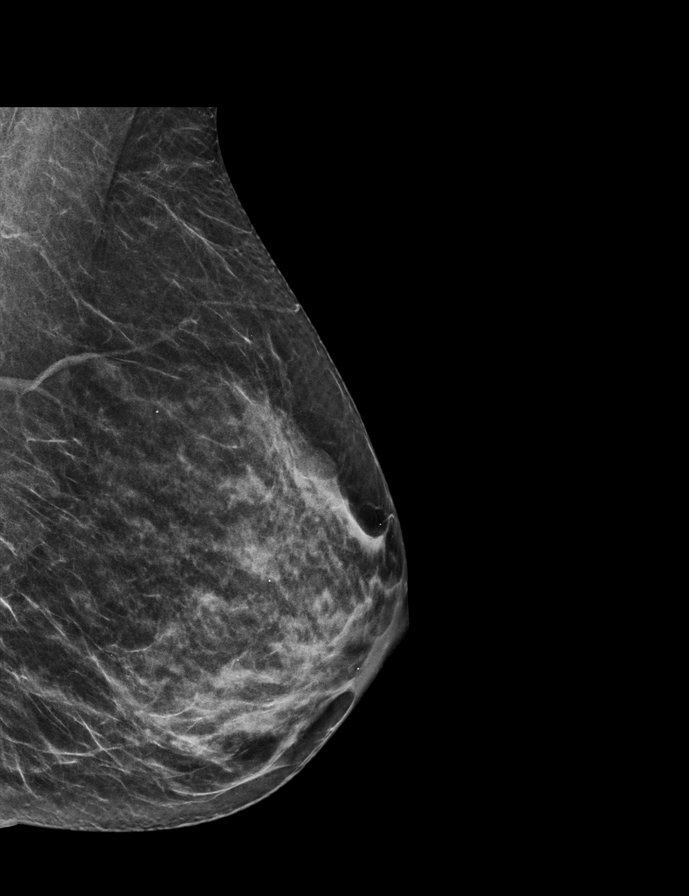

[R CC synth-2D]
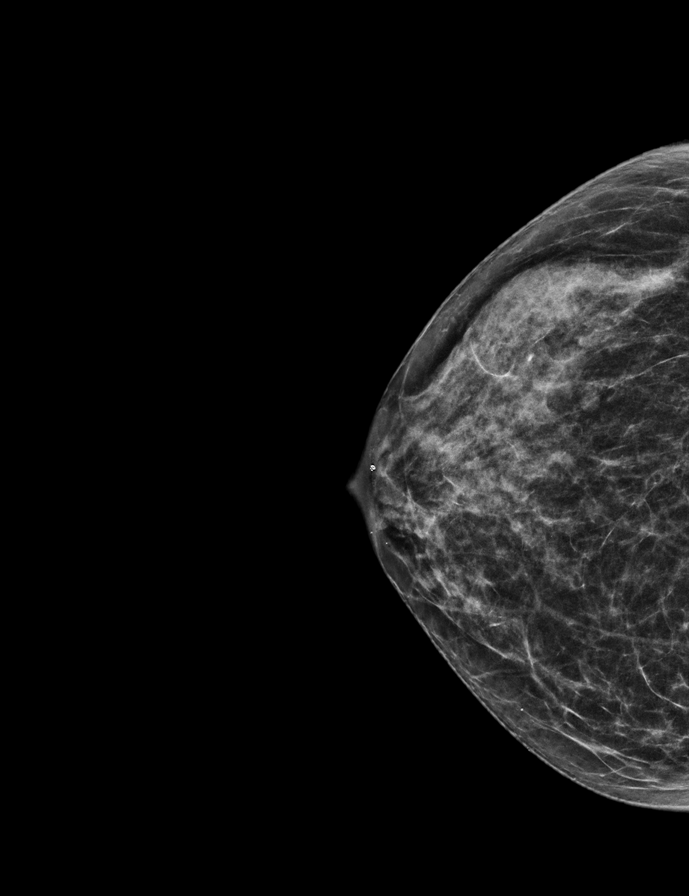

[R MLO synth-2D]
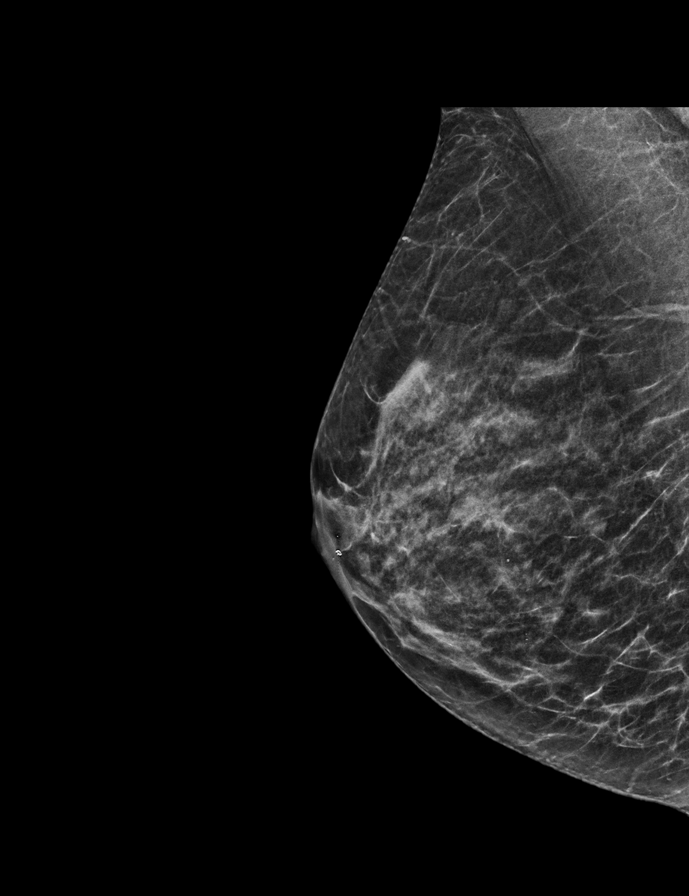

[R CC tomo · 2 of 50 frames shown]
[frame 17/50]
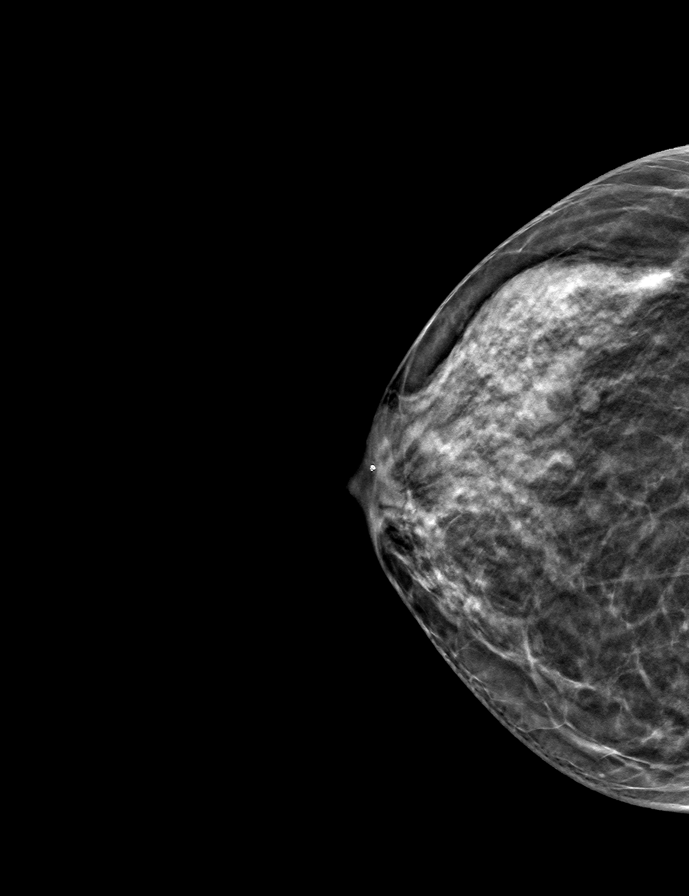
[frame 25/50]
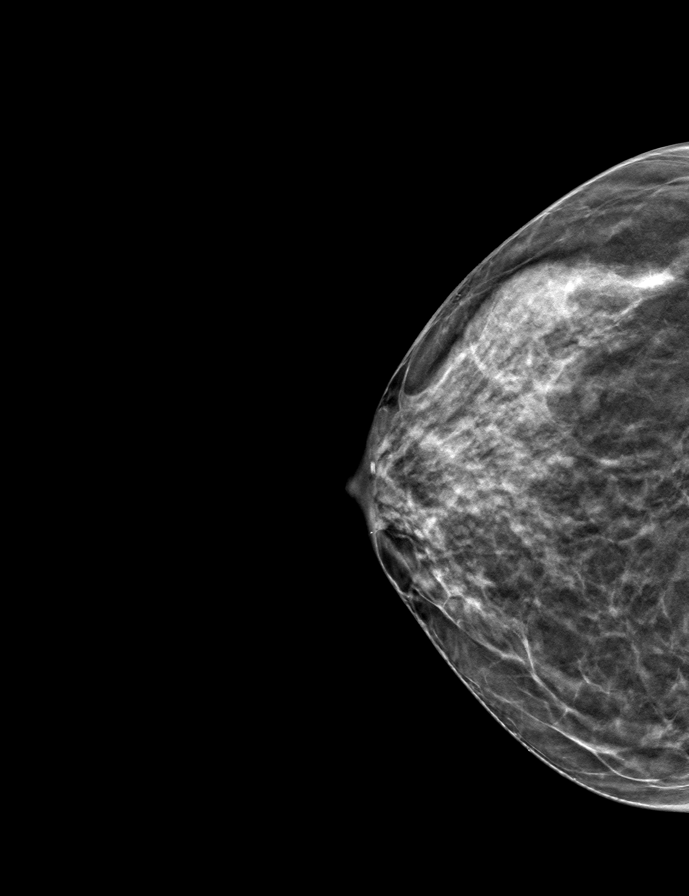

[L CC tomo · tomo slice 27/54.0]
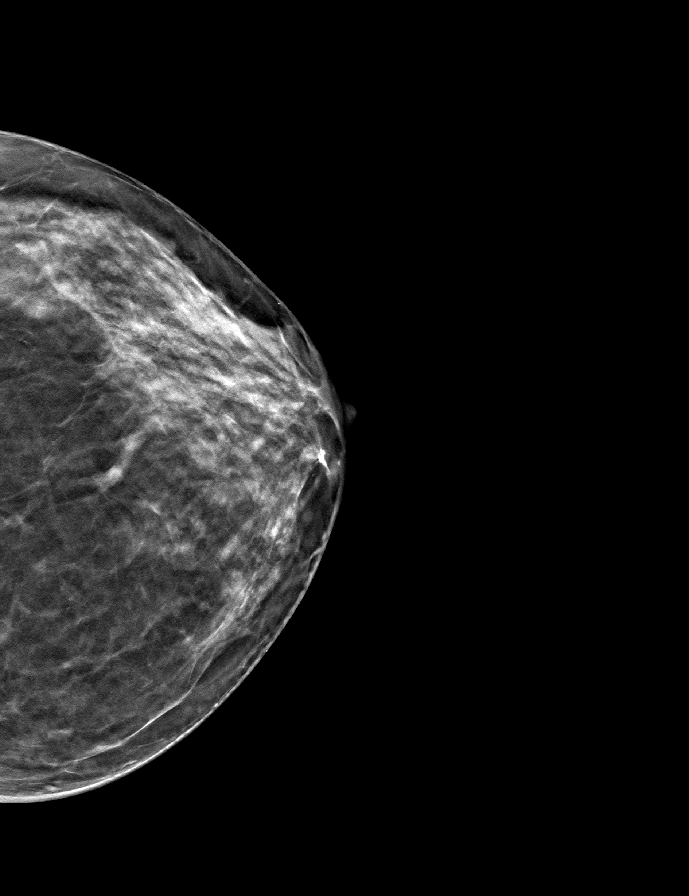

[R MLO tomo · tomo slice 24/47.0]
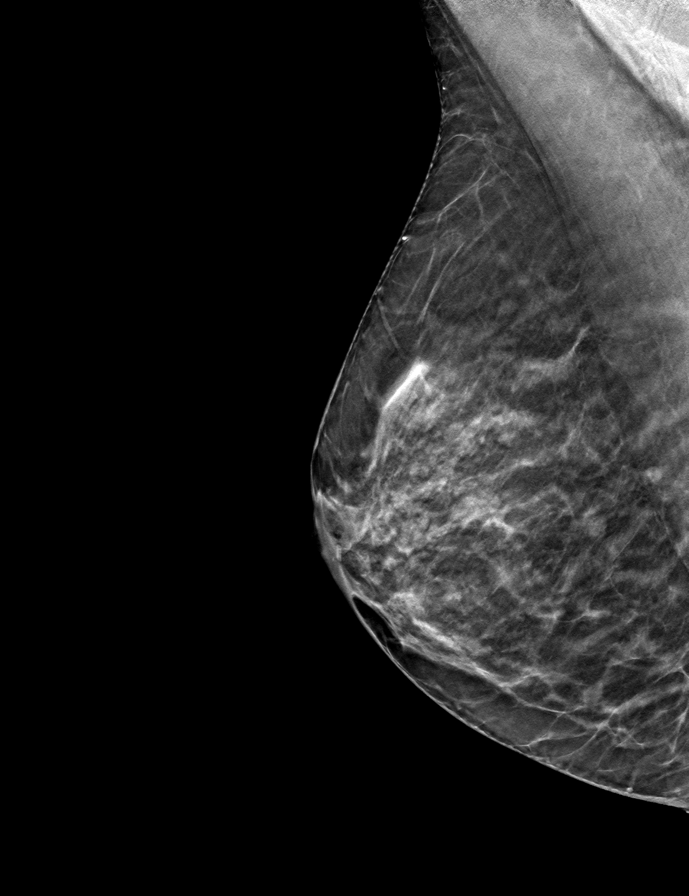

[L MLO tomo · tomo slice 27/53.0]
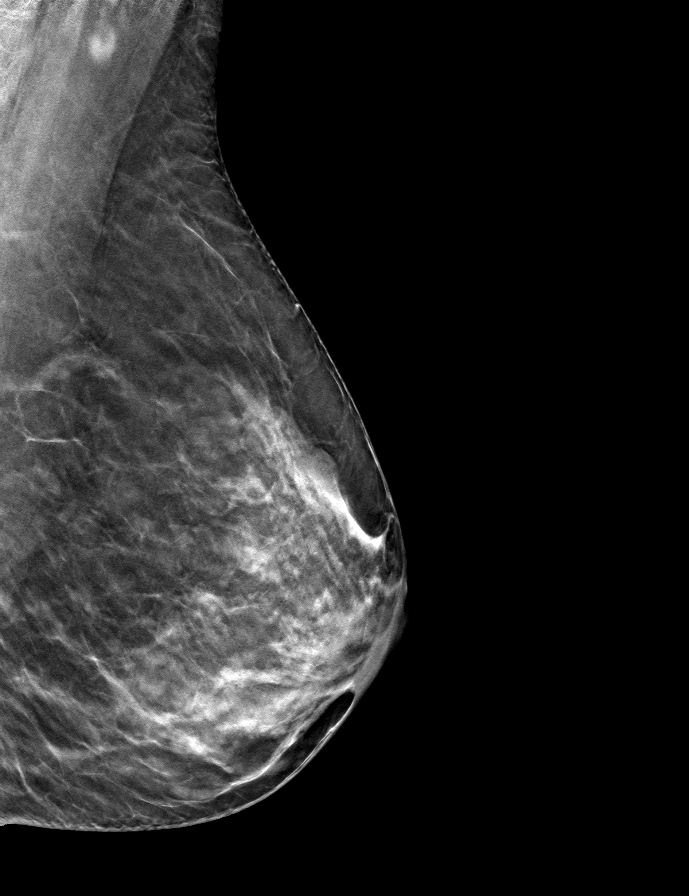

[9 of 24 positions shown; findings below may reference images not displayed]

ACR Breast Density Category c: The breast tissue is heterogeneously
dense, which may obscure small masses.
FINDINGS: In the right breast, a possible mass warrants further evaluation. In
the left breast, no findings suspicious for malignancy. Images were
processed with CAD.
IMPRESSION: Further evaluation is suggested for possible mass in the right
breast.

RECOMMENDATION:
Diagnostic mammogram and possibly ultrasound of the right breast.
(Code:[X0])

The patient will be contacted regarding the findings, and additional
imaging will be scheduled.

BI-RADS CATEGORY  0: Incomplete. Need additional imaging evaluation
and/or prior mammograms for comparison.

## 2019-03-17 ENCOUNTER — Other Ambulatory Visit: Payer: Self-pay | Admitting: Obstetrics & Gynecology

## 2019-03-17 DIAGNOSIS — R928 Other abnormal and inconclusive findings on diagnostic imaging of breast: Secondary | ICD-10-CM

## 2019-03-21 ENCOUNTER — Ambulatory Visit
Admission: RE | Admit: 2019-03-21 | Discharge: 2019-03-21 | Disposition: A | Discharge status: Home / Self Care | Payer: PRIVATE HEALTH INSURANCE | Source: Ambulatory Visit | Attending: Obstetrics & Gynecology | Admitting: Obstetrics & Gynecology

## 2019-03-21 ENCOUNTER — Other Ambulatory Visit: Payer: Self-pay

## 2019-03-21 DIAGNOSIS — R928 Other abnormal and inconclusive findings on diagnostic imaging of breast: Secondary | ICD-10-CM

## 2019-03-21 IMAGING — MG DIGITAL DIAGNOSTIC UNILATERAL RIGHT MAMMOGRAM WITH TOMO AND CAD
4 series · 4 of 12 positions shown · non-contrast
Comparison: Previous exam(s).

CLINICAL DATA: 52-year-old female for further evaluation of
possible RIGHT breast mass on screening mammogram

EXAM:
DIGITAL DIAGNOSTIC RIGHT MAMMOGRAM WITH CAD AND TOMO
ULTRASOUND RIGHT BREAST

[R MLO synth-2D]
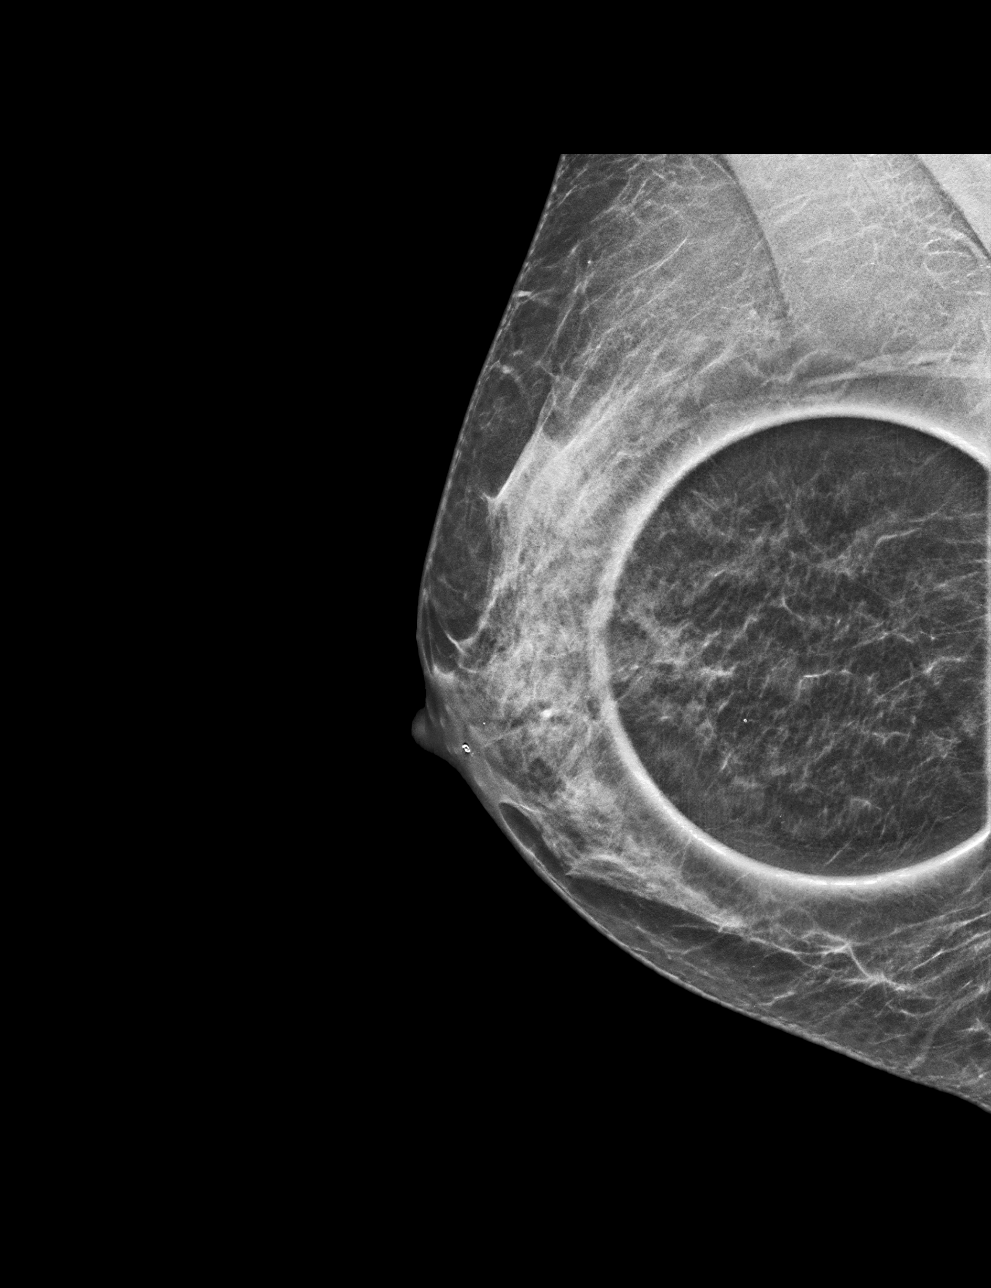

[R CC synth-2D]
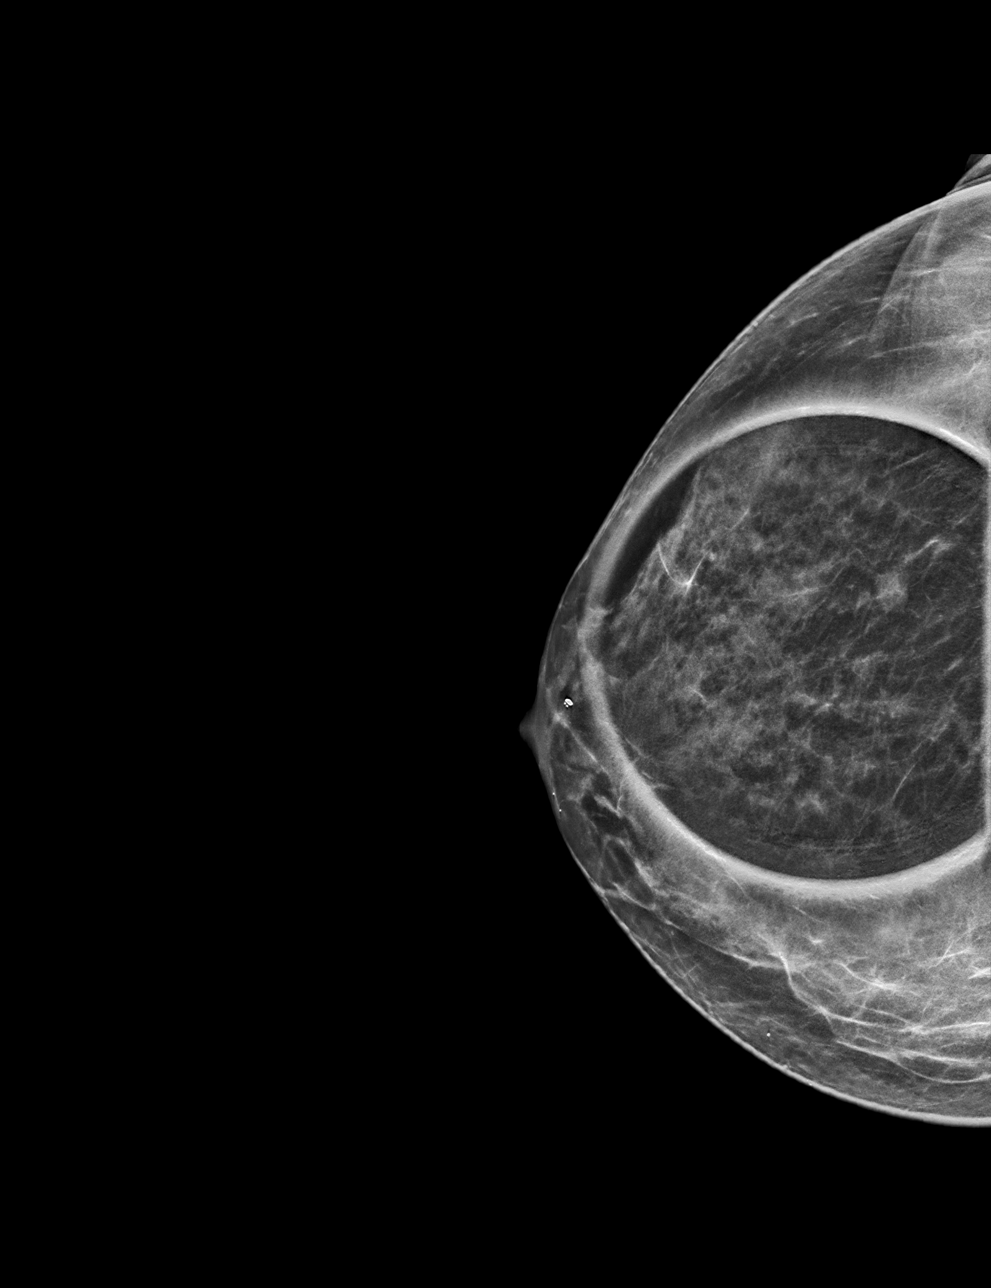

[R MLO tomo · tomo slice 21/40.0]
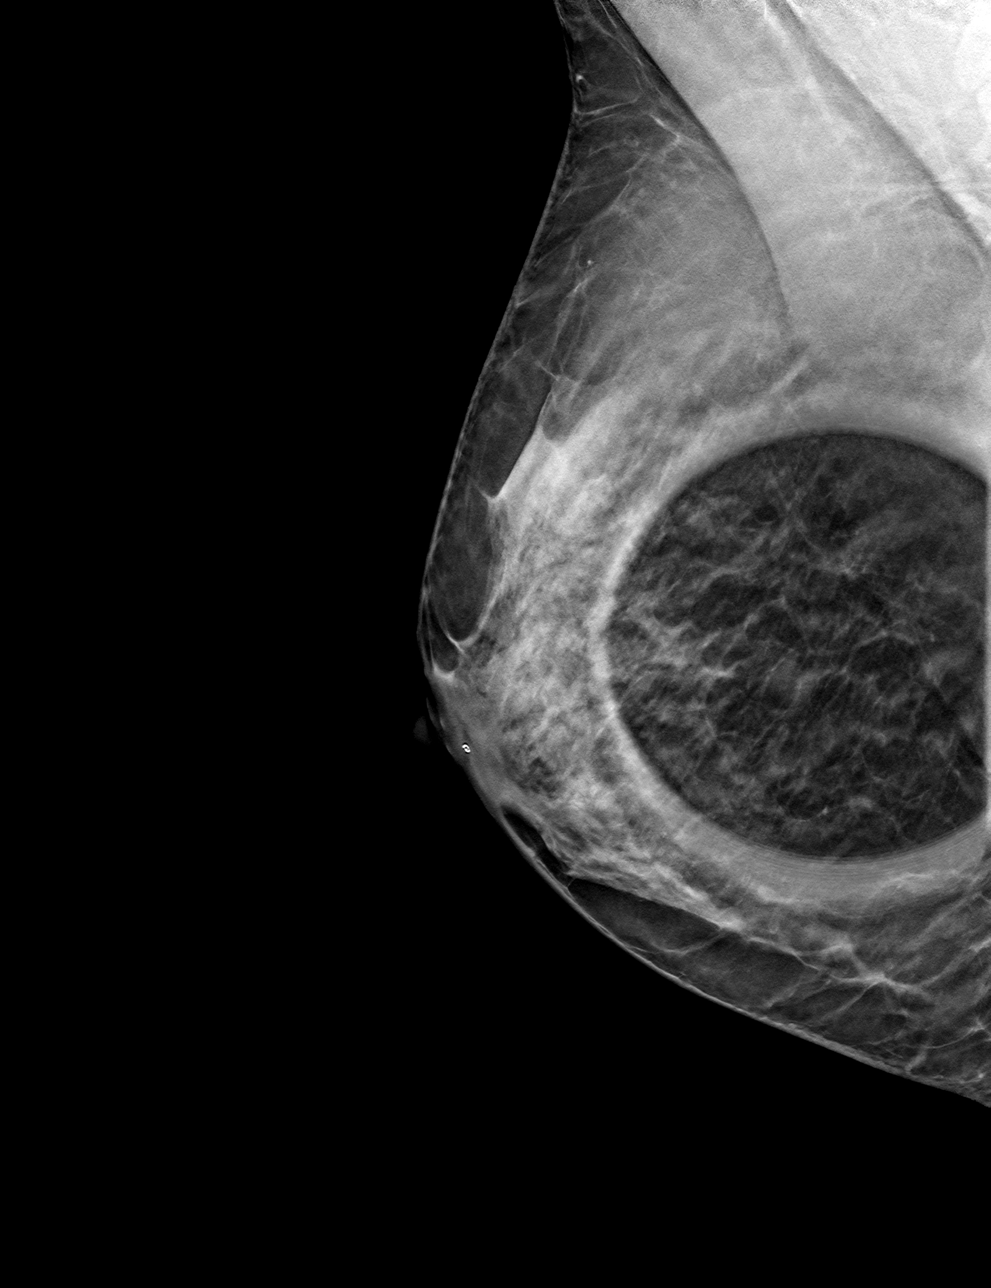

[R CC tomo · tomo slice 25/48.0]
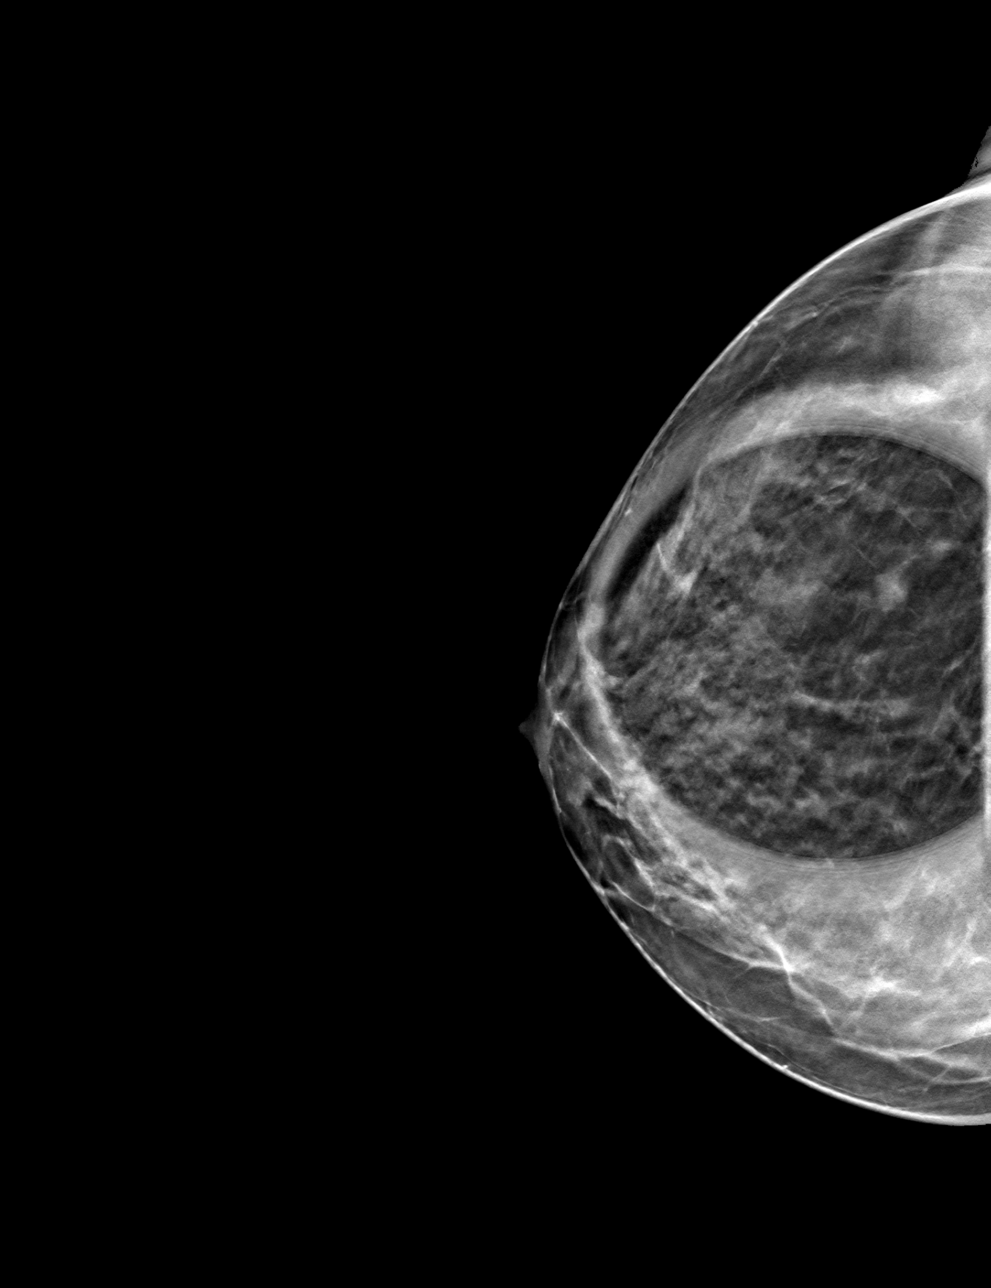

[4 of 12 positions shown; findings below may reference images not displayed]

ACR Breast Density Category b: There are scattered areas of
fibroglandular density.
FINDINGS: 2D/3D spot compression views of the RIGHT breast demonstrate a
persistent low-density circumscribed 0.8 cm oval mass within the
OUTER RIGHT breast.

Mammographic images were processed with CAD.

Targeted ultrasound is performed, showing a 0.5 x 0.4 x 0.8 cm
simple cyst at the 8 o'clock position of the RIGHT breast 4 cm from
the nipple, corresponding to the mammographic finding.
IMPRESSION: Simple cyst within the OUTER RIGHT breast corresponding to the
screening study finding.

RECOMMENDATION:
Bilateral screening mammogram in 1 year

I have discussed the findings and recommendations with the patient.
Results were also provided in writing at the conclusion of the
visit. If applicable, a reminder letter will be sent to the patient
regarding the next appointment.

BI-RADS CATEGORY  2: Benign.

## 2019-03-21 IMAGING — US ULTRASOUND RIGHT BREAST LIMITED
1 series · 8 of 8 positions shown · non-contrast
Comparison: Previous exam(s).

CLINICAL DATA: 52-year-old female for further evaluation of
possible RIGHT breast mass on screening mammogram

EXAM:
DIGITAL DIAGNOSTIC RIGHT MAMMOGRAM WITH CAD AND TOMO
ULTRASOUND RIGHT BREAST

[Series 1: ultrasound right breast limited · 0.06mm/px · 8 of 8 slices shown]
[im 1/8]
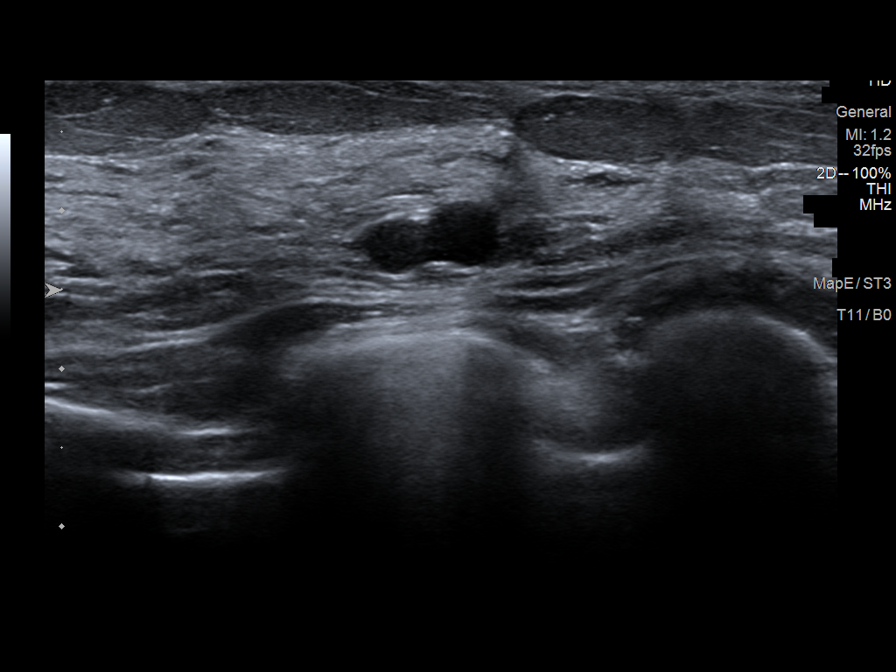
[im 2/8]
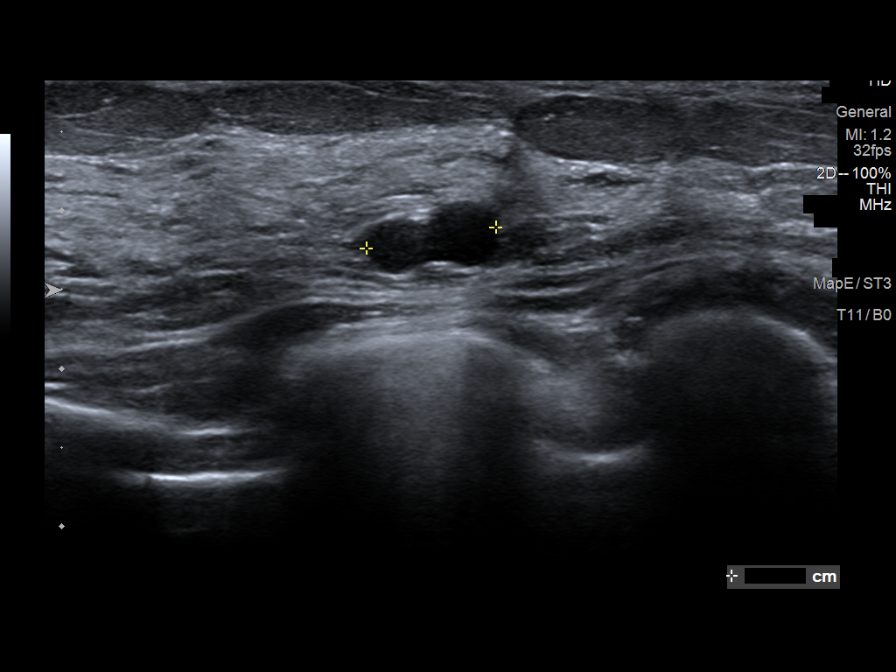
[im 3/8]
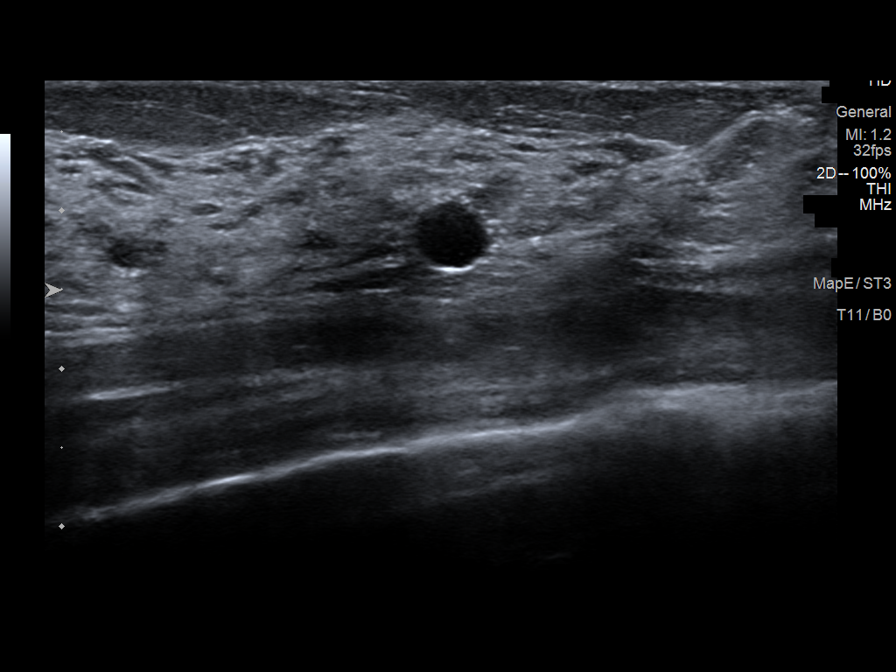
[im 4/8]
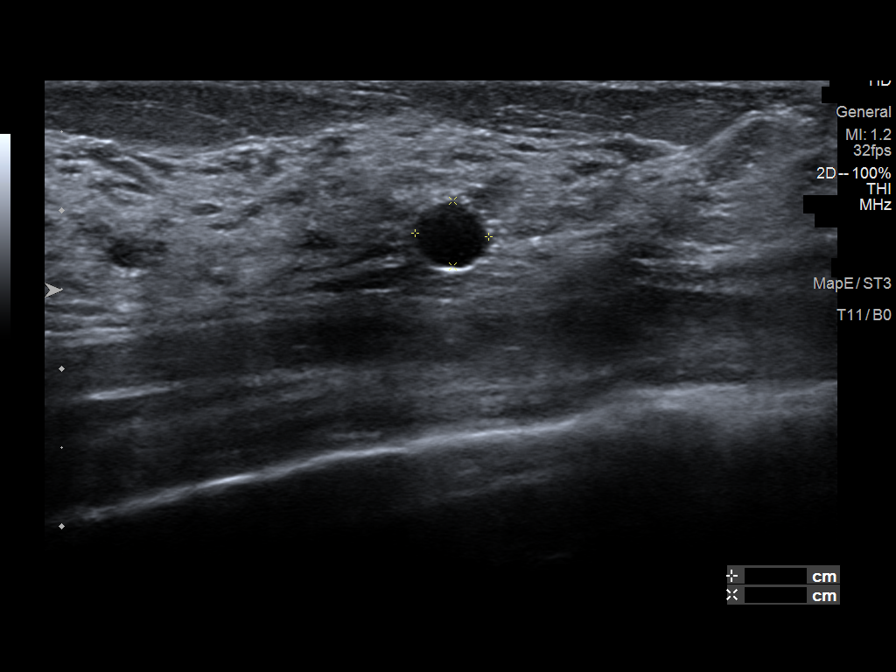
[im 5/8]
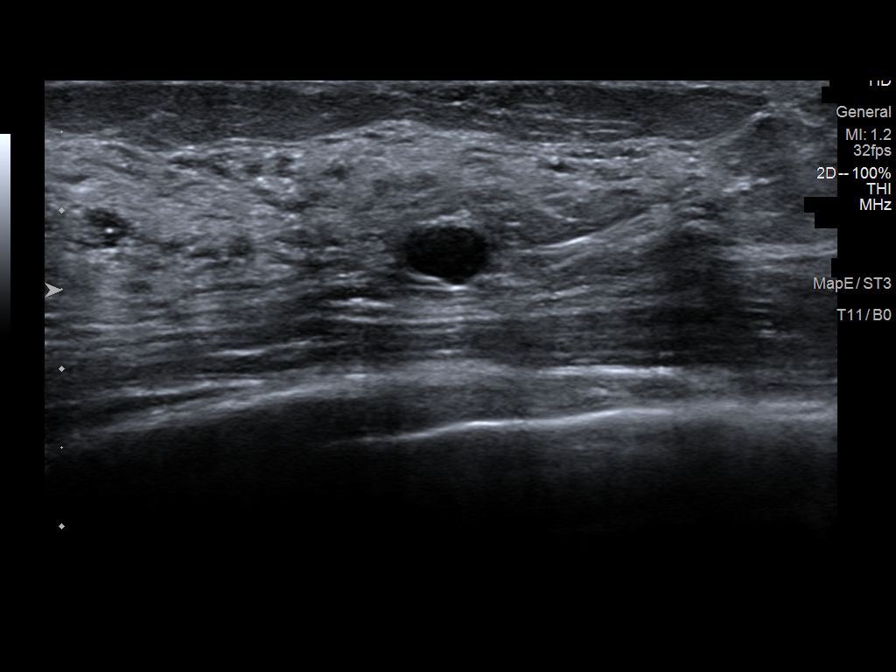
[im 6/8]
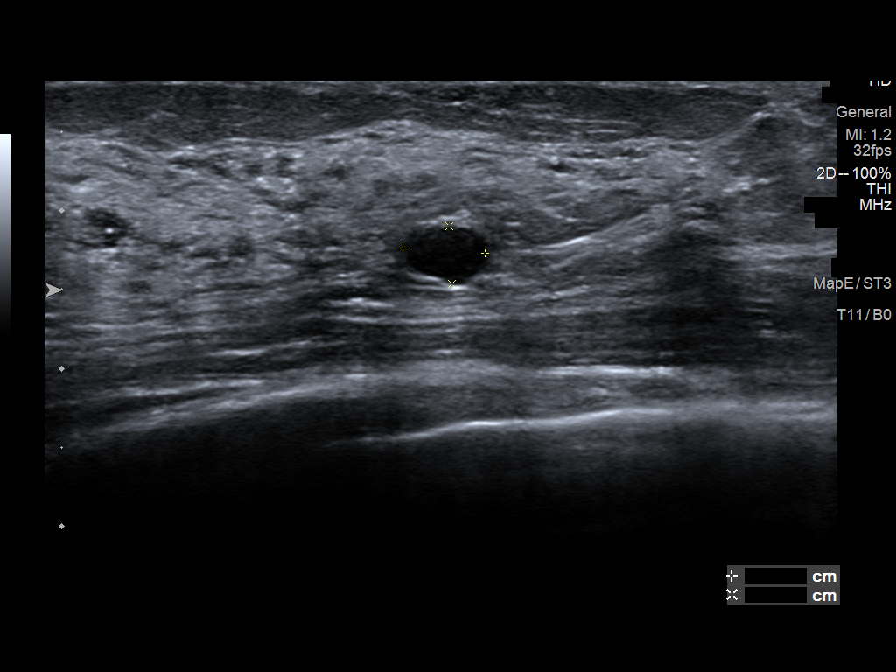
[im 7/8]
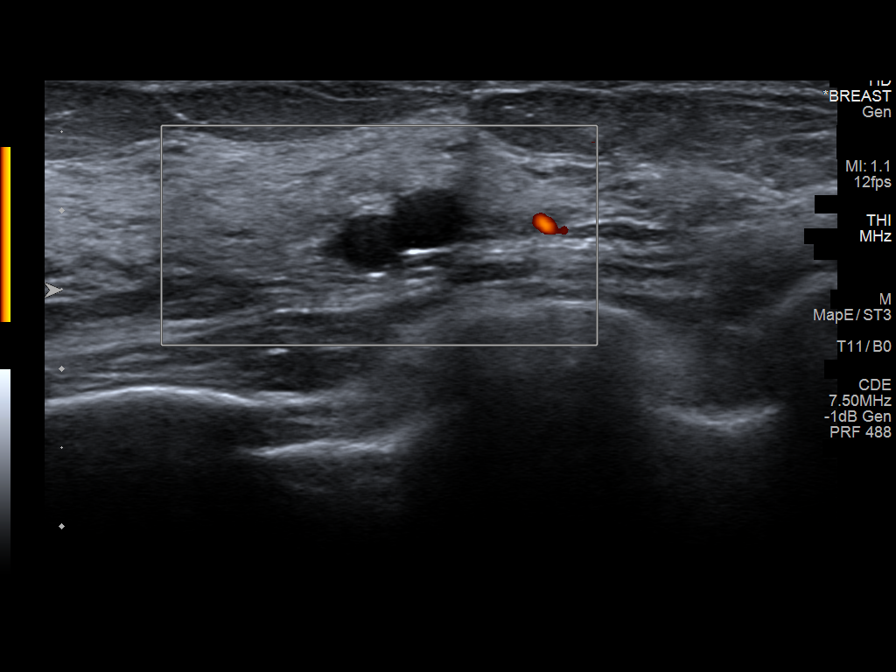
[im 8/8]
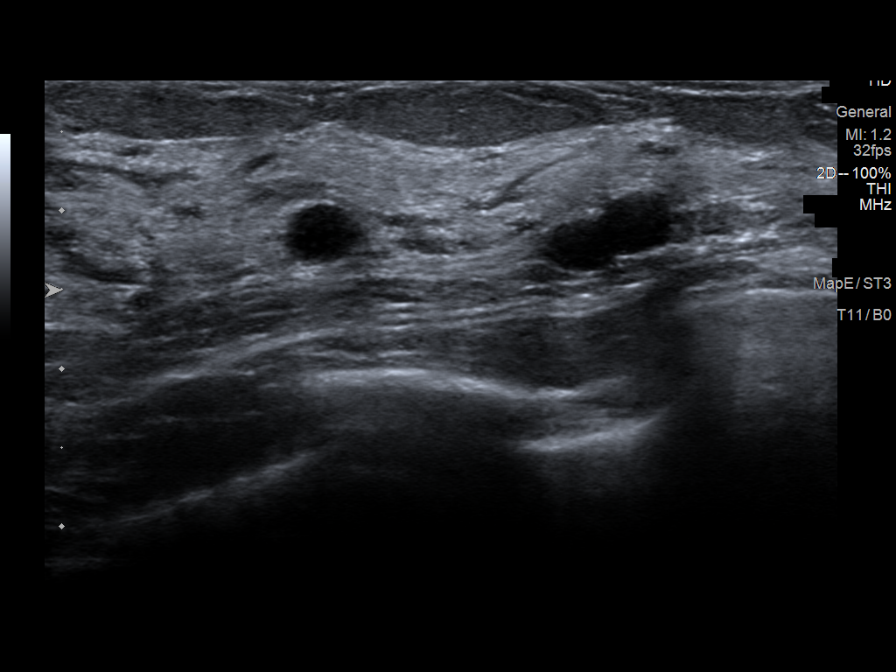

[8 of 8 positions shown; findings below may reference images not displayed]

ACR Breast Density Category b: There are scattered areas of
fibroglandular density.
FINDINGS: 2D/3D spot compression views of the RIGHT breast demonstrate a
persistent low-density circumscribed 0.8 cm oval mass within the
OUTER RIGHT breast.

Mammographic images were processed with CAD.

Targeted ultrasound is performed, showing a 0.5 x 0.4 x 0.8 cm
simple cyst at the 8 o'clock position of the RIGHT breast 4 cm from
the nipple, corresponding to the mammographic finding.
IMPRESSION: Simple cyst within the OUTER RIGHT breast corresponding to the
screening study finding.

RECOMMENDATION:
Bilateral screening mammogram in 1 year

I have discussed the findings and recommendations with the patient.
Results were also provided in writing at the conclusion of the
visit. If applicable, a reminder letter will be sent to the patient
regarding the next appointment.

BI-RADS CATEGORY  2: Benign.

## 2020-05-11 DIAGNOSIS — K581 Irritable bowel syndrome with constipation: Secondary | ICD-10-CM | POA: Diagnosis not present

## 2020-05-11 DIAGNOSIS — R0602 Shortness of breath: Secondary | ICD-10-CM | POA: Diagnosis not present

## 2020-05-11 DIAGNOSIS — R5383 Other fatigue: Secondary | ICD-10-CM | POA: Diagnosis not present

## 2020-05-11 DIAGNOSIS — R001 Bradycardia, unspecified: Secondary | ICD-10-CM | POA: Diagnosis not present

## 2020-05-11 DIAGNOSIS — R002 Palpitations: Secondary | ICD-10-CM | POA: Diagnosis not present

## 2020-05-12 DIAGNOSIS — M4802 Spinal stenosis, cervical region: Secondary | ICD-10-CM | POA: Diagnosis not present

## 2020-05-12 DIAGNOSIS — M4312 Spondylolisthesis, cervical region: Secondary | ICD-10-CM | POA: Diagnosis not present

## 2020-05-12 DIAGNOSIS — K581 Irritable bowel syndrome with constipation: Secondary | ICD-10-CM | POA: Diagnosis not present

## 2020-05-12 DIAGNOSIS — R5383 Other fatigue: Secondary | ICD-10-CM | POA: Diagnosis not present

## 2020-05-12 DIAGNOSIS — K5900 Constipation, unspecified: Secondary | ICD-10-CM | POA: Diagnosis not present

## 2020-05-12 DIAGNOSIS — R0602 Shortness of breath: Secondary | ICD-10-CM | POA: Diagnosis not present

## 2020-05-12 DIAGNOSIS — M79601 Pain in right arm: Secondary | ICD-10-CM | POA: Diagnosis not present

## 2020-05-12 DIAGNOSIS — M25511 Pain in right shoulder: Secondary | ICD-10-CM | POA: Diagnosis not present

## 2020-05-24 ENCOUNTER — Other Ambulatory Visit: Payer: Self-pay | Admitting: Physician Assistant

## 2020-05-24 DIAGNOSIS — M5412 Radiculopathy, cervical region: Secondary | ICD-10-CM

## 2020-06-14 ENCOUNTER — Ambulatory Visit
Admission: RE | Admit: 2020-06-14 | Discharge: 2020-06-14 | Disposition: A | Discharge status: Home / Self Care | Payer: BC Managed Care – PPO | Source: Ambulatory Visit | Attending: Physician Assistant | Admitting: Physician Assistant

## 2020-06-14 ENCOUNTER — Other Ambulatory Visit: Payer: Self-pay

## 2020-06-14 DIAGNOSIS — M5412 Radiculopathy, cervical region: Secondary | ICD-10-CM

## 2020-06-14 DIAGNOSIS — M4802 Spinal stenosis, cervical region: Secondary | ICD-10-CM | POA: Diagnosis not present

## 2020-06-14 IMAGING — MR MR CERVICAL SPINE W/O CM
4 of 5 series · 29 of 48 positions shown · non-contrast
Comparison: Prior radiograph from [DATE].

CLINICAL DATA: Initial evaluation for right arm pain, numbness, and
tingling for 3 months.

EXAM:
MRI CERVICAL SPINE WITHOUT CONTRAST
TECHNIQUE: Multiplanar, multisequence MR imaging of the cervical spine was
performed. No intravenous contrast was administered.

[Series 5: T2 · sagittal · 3.0mm · 0.55mm/px · 8 of 17 slices shown (1 of 2)]
[im 1/17]
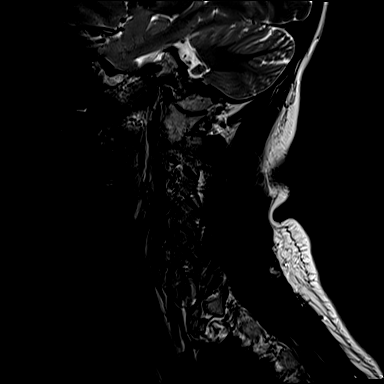
[im 3/17]
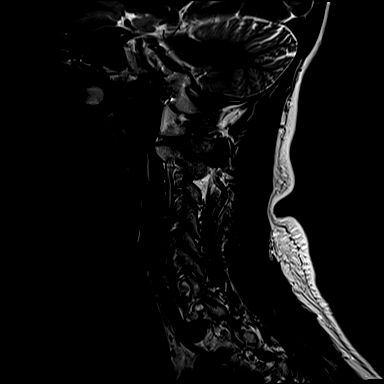
[im 5/17]
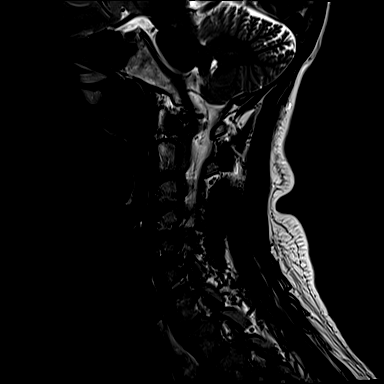
[im 7/17]
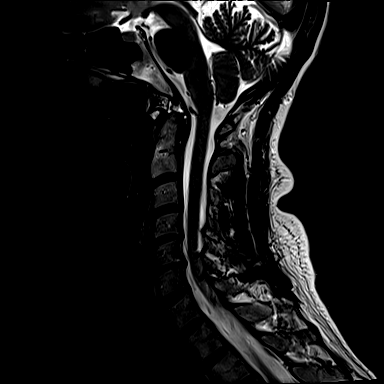
[im 10/17]
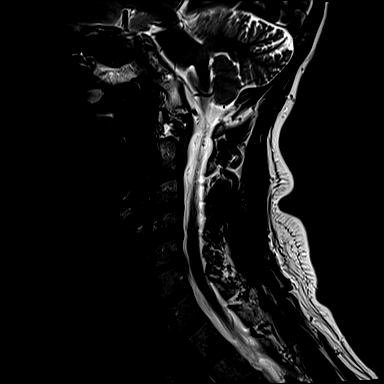
[im 12/17]
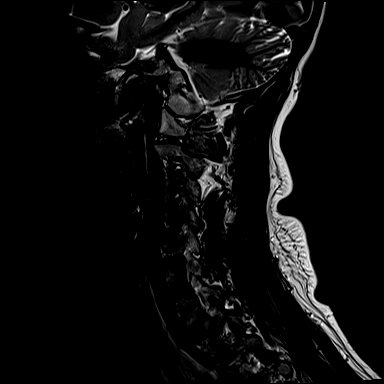
[im 14/17]
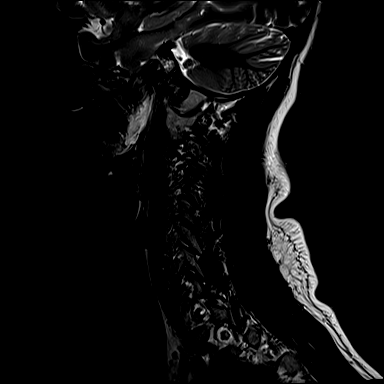
[im 17/17]
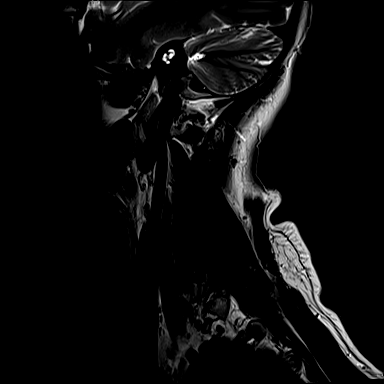

[Series 6: T1 · sagittal · 3.0mm · 0.66mm/px · 7 of 17 slices shown]
[im 1/17]
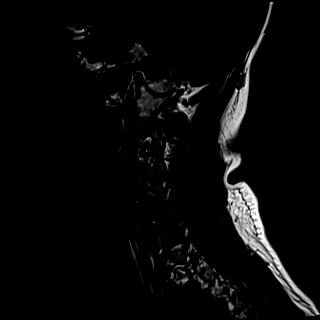
[im 3/17]
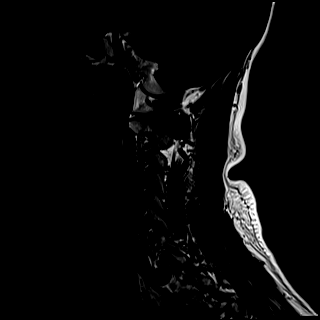
[im 6/17]
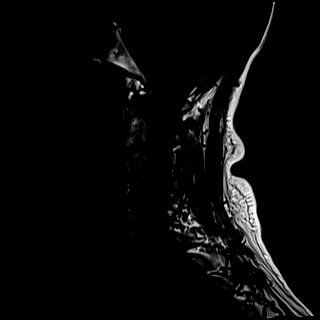
[im 9/17]
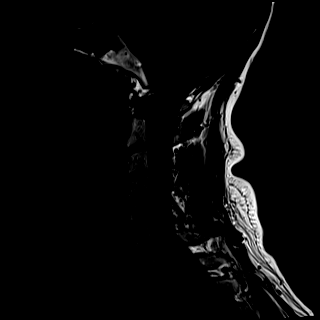
[im 11/17]
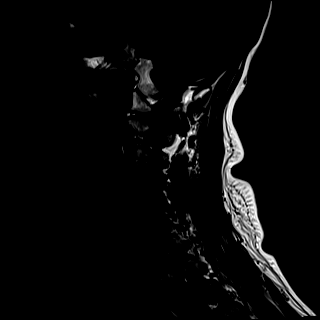
[im 14/17]
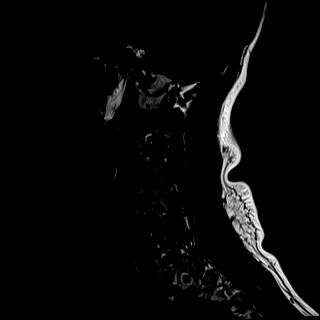
[im 17/17]
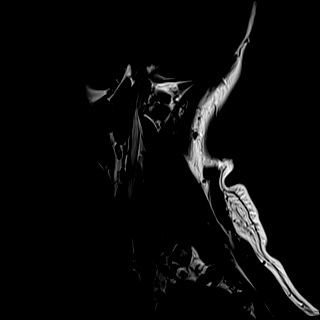

[Series 7: STIR · sagittal · 3.0mm · 0.33mm/px · 5 of 17 slices shown]
[im 1/17]
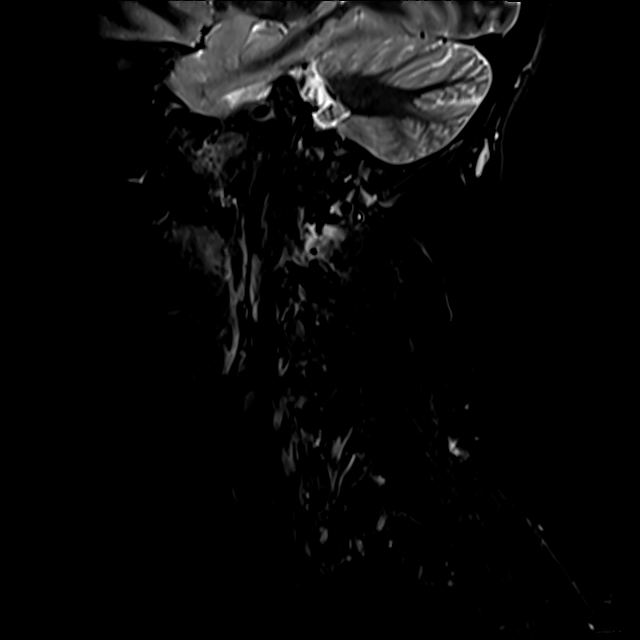
[im 3/17]
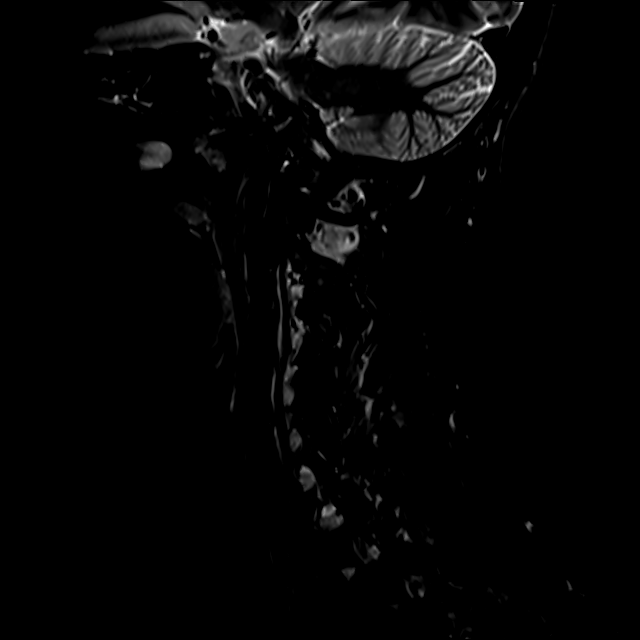
[im 6/17]
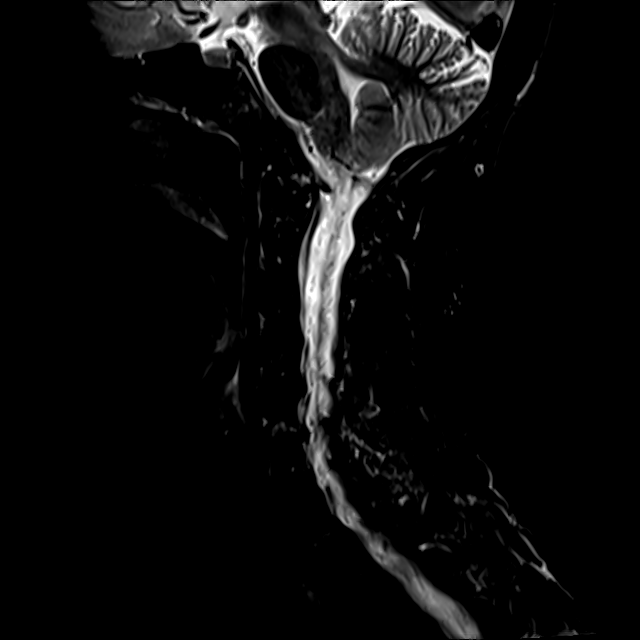
[im 9/17]
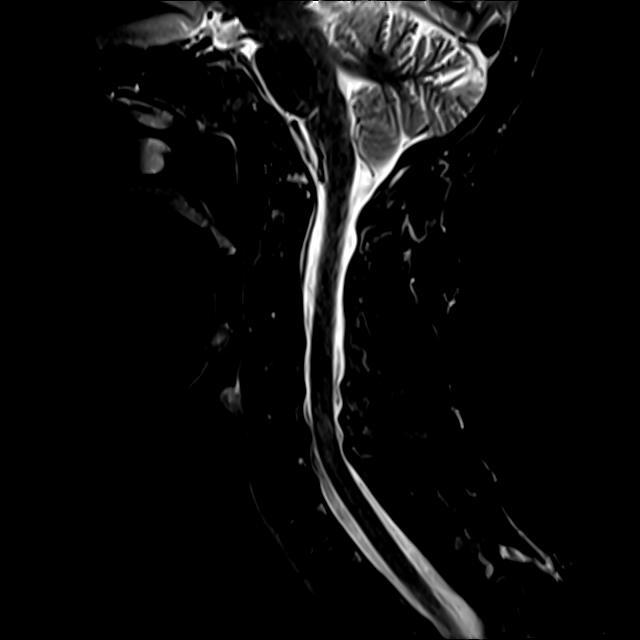
[im 14/17]
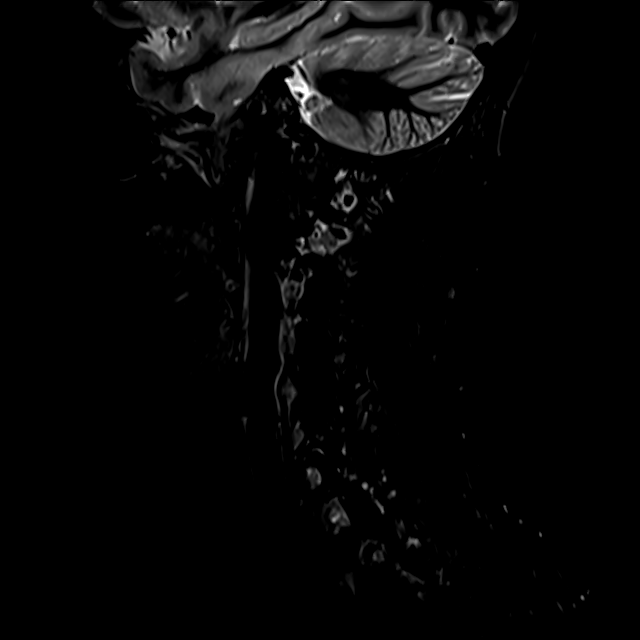

[Series 8: T2 · axial · 3.0mm · 0.50mm/px · z∈[-105,-11]mm · 9 of 32 slices shown (2 of 2)]
[im 1/32]
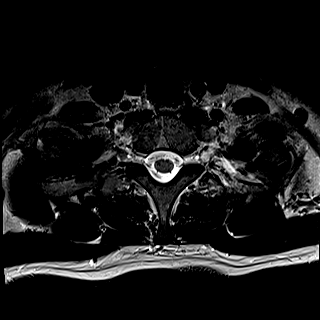
[im 6/32]
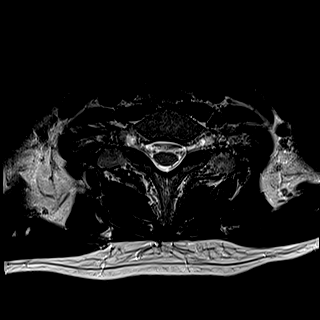
[im 11/32]
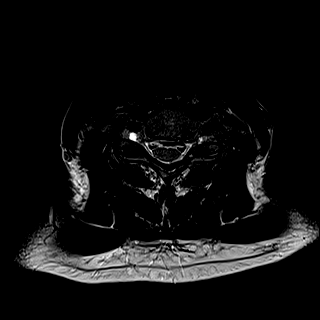
[im 13/32]
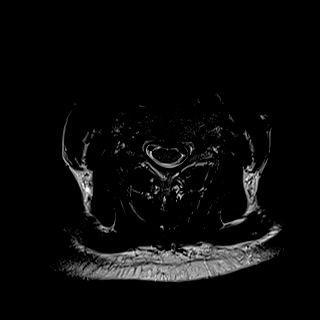
[im 16/32]
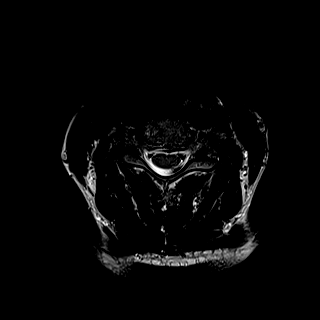
[im 19/32]
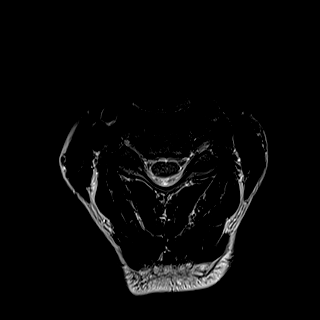
[im 21/32]
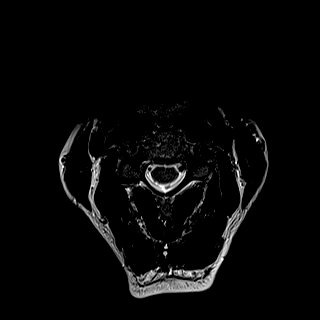
[im 26/32]
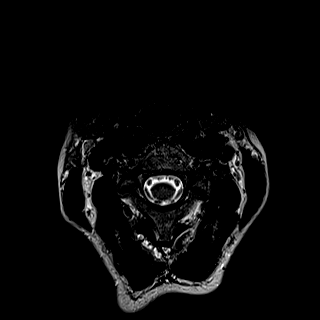
[im 32/32]
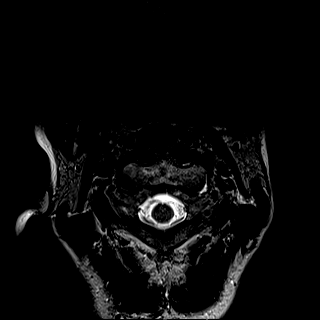

[29 of 48 positions shown; findings below may reference images not displayed]

FINDINGS: Alignment: Straightening of the normal cervical lordosis. No
listhesis.

Vertebrae: Vertebral body height maintained without acute or chronic
fracture. Bone marrow signal intensity within normal limits. No
discrete or worrisome osseous lesions. Mild reactive endplate
changes noted about the C5-6 interspace. No other abnormal marrow
edema.

Cord: Normal signal and morphology.

Posterior Fossa, vertebral arteries, paraspinal tissues: Visualized
brain and posterior fossa within normal limits. Craniocervical
junction normal. Paraspinous and prevertebral soft tissues within
normal limits. Normal intravascular flow voids seen within the
vertebral arteries bilaterally.

Disc levels:

C2-C3: Unremarkable.

C3-C4: Mild annular disc bulge. No spinal stenosis. Foramina remain
patent.

C4-C5: Degenerative intervertebral disc space narrowing with diffuse
disc bulge and bilateral uncovertebral hypertrophy, greater on the
right. Mild flattening of the ventral thecal sac without significant
spinal stenosis or cord deformity. Mild to moderate right with mild
left C5 foraminal stenosis.

C5-C6: Degenerative intervertebral disc space narrowing with diffuse
disc bulge, slightly asymmetric to the right. Associated right
greater than left uncovertebral hypertrophy. Mild flattening of the
ventral thecal sac without significant spinal stenosis or cord
deformity. Moderate right with mild left C6 foraminal stenosis.

C6-C7: Mild annular disc bulge. No spinal stenosis. Foramina remain
patent.

C7-T1: Normal interspace. Mild facet hypertrophy. No stenosis or
impingement.

Visualized upper thoracic spine demonstrates no significant finding.
IMPRESSION: 1. Right eccentric disc bulge with uncovertebral hypertrophy at C5-6
with resultant moderate right C6 foraminal stenosis.
2. Disc bulging with uncovertebral hypertrophy at C4-5 with
resultant mild to moderate right and mild left C5 foraminal
stenosis.
3. Additional mild noncompressive disc bulging at C3-4 and C6-7
without significant stenosis or impingement.

## 2020-06-22 DIAGNOSIS — M5412 Radiculopathy, cervical region: Secondary | ICD-10-CM | POA: Diagnosis not present

## 2020-06-22 DIAGNOSIS — R399 Unspecified symptoms and signs involving the genitourinary system: Secondary | ICD-10-CM | POA: Diagnosis not present

## 2020-06-22 DIAGNOSIS — M542 Cervicalgia: Secondary | ICD-10-CM | POA: Diagnosis not present

## 2020-06-22 DIAGNOSIS — N390 Urinary tract infection, site not specified: Secondary | ICD-10-CM | POA: Diagnosis not present

## 2020-07-06 ENCOUNTER — Ambulatory Visit: Payer: Self-pay | Admitting: Cardiology

## 2020-07-26 ENCOUNTER — Other Ambulatory Visit: Payer: Self-pay

## 2020-07-26 ENCOUNTER — Ambulatory Visit: Payer: BC Managed Care – PPO | Admitting: Cardiology

## 2020-07-26 ENCOUNTER — Encounter: Payer: Self-pay | Admitting: Cardiology

## 2020-07-26 ENCOUNTER — Encounter: Payer: Self-pay | Admitting: *Deleted

## 2020-07-26 VITALS — BP 126/76 | HR 59 | Ht 65.0 in | Wt 113.6 lb

## 2020-07-26 DIAGNOSIS — R002 Palpitations: Secondary | ICD-10-CM | POA: Diagnosis not present

## 2020-07-26 DIAGNOSIS — R079 Chest pain, unspecified: Secondary | ICD-10-CM

## 2020-07-26 DIAGNOSIS — R0600 Dyspnea, unspecified: Secondary | ICD-10-CM

## 2020-07-26 DIAGNOSIS — R0609 Other forms of dyspnea: Secondary | ICD-10-CM

## 2020-07-26 DIAGNOSIS — R072 Precordial pain: Secondary | ICD-10-CM

## 2020-07-26 MED ORDER — METOPROLOL TARTRATE 100 MG PO TABS: 100.0000 mg | ORAL_TABLET | Freq: Once | ORAL | 0 refills | Status: DC

## 2020-07-26 NOTE — Progress Notes (Signed)
Patient ID: Michelle Kramer, female   DOB: 06/10/1966, 54 y.o.   MRN: [id number] Patient enrolled for [employer] to ship a 14 day ZIO XT Long term holter monitor to her home.

## 2020-07-26 NOTE — Patient Instructions (Signed)
Medication Instructions:  The current medical regimen is effective;  continue present plan and medications.  *If you need a refill on your cardiac medications before your next appointment, please call your pharmacy*  Testing/Procedures: Your physician has requested that you have an echocardiogram. Echocardiography is a painless test that uses sound waves to create images of your heart. It provides your doctor with information about the size and shape of your heart and how well your heart's chambers and valves are working. This procedure takes approximately one hour. There are no restrictions for this procedure.  ZIO XT- Long Term Monitor Instructions   Your physician has requested you wear your ZIO patch monitor 14 days.   This is a single patch monitor.  Irhythm supplies one patch monitor per enrollment.  Additional stickers are not available.   Please do not apply patch if you will be having a Nuclear Stress Test, Echocardiogram, Cardiac CT, MRI, or Chest Xray during the time frame you would be wearing the monitor. The patch cannot be worn during these tests.  You cannot remove and re-apply the ZIO XT patch monitor.   Your ZIO patch monitor will be sent USPS Priority mail from [facility] directly to your home address. The monitor may also be mailed to a PO BOX if home delivery is not available.   It may take 3-5 days to receive your monitor after you have been enrolled.   Once you have received you monitor, please review enclosed instructions.  Your monitor has already been registered assigning a specific monitor serial # to you.   Applying the monitor   Shave hair from upper left chest.   Hold abrader disc by orange tab.  Rub abrader in 40 strokes over left upper chest as indicated in your monitor instructions.   Clean area with 4 enclosed alcohol pads .  Use all pads to assure are is cleaned thoroughly.  Let dry.   Apply patch as indicated in monitor instructions.  Patch  will be place under collarbone on left side of chest with arrow pointing upward.   Rub patch adhesive wings for 2 minutes.Remove white label marked "1".  Remove white label marked "2".  Rub patch adhesive wings for 2 additional minutes.   While looking in a mirror, press and release button in center of patch.  A small green light will flash 3-4 times .  This will be your only indicator the monitor has been turned on.     Do not shower for the first 24 hours.  You may shower after the first 24 hours.   Press button if you feel a symptom. You will hear a small click.  Record Date, Time and Symptom in the Patient Log Book.   When you are ready to remove patch, follow instructions on last 2 pages of Patient Log Book.  Stick patch monitor onto last page of Patient Log Book.   Place Patient Log Book in [place] box.  Use locking tab on box and tape box closed securely.  The Orange and [employer] has [employer] on it.  Please place in mailbox as soon as possible.  Your physician should have your test results approximately 7 days after the monitor has been mailed back to [facility].   Call [facility] Customer Care at [phone] if you have questions regarding your ZIO XT patch monitor.  Call them immediately if you see an orange light blinking on your monitor.   If your monitor falls off in  less than 4 days contact our Monitor department at [phone].  If your monitor becomes loose or falls off after 4 days call [employer] at [phone] for suggestions on securing your monitor.   Your cardiac CT will be scheduled at:   [facility] [address] [facility], [place] [address] [phone]  Please arrive at the [facility] main entrance of [facility] 30 minutes prior to test start time. Proceed to the [facility] Radiology Department (first floor) to check-in and test prep.  Please follow these instructions carefully (unless otherwise directed):  Hold all  erectile dysfunction medications at least 3 days (72 hrs) prior to test.  On the Night Before the Test: . Be sure to Drink plenty of water. . Do not consume any caffeinated/decaffeinated beverages or chocolate 12 hours prior to your test. . Do not take any antihistamines 12 hours prior to your test.  On the Day of the Test: . Drink plenty of water. Do not drink any water within one hour of the test. . Do not eat any food 4 hours prior to the test. . You may take your regular medications prior to the test.  . Take metoprolol (Lopressor) two hours prior to test. . HOLD Furosemide/Hydrochlorothiazide morning of the test. . FEMALES- please wear underwire-free bra if available  After the Test: . Drink plenty of water. . After receiving IV contrast, you may experience a mild flushed feeling. This is normal. . On occasion, you may experience a mild rash up to 24 hours after the test. This is not dangerous. If this occurs, you can take Benadryl 25 mg and increase your fluid intake. . If you experience trouble breathing, this can be serious. If it is severe call 911 IMMEDIATELY. If it is mild, please call our office. . If you take any of these medications: Glipizide/Metformin, Avandament, Glucavance, please do not take 48 hours after completing test unless otherwise instructed.   Once we have confirmed authorization from your insurance company, we will call you to set up a date and time for your test. Based on how quickly your insurance processes prior authorizations requests, please allow up to 4 weeks to be contacted for scheduling your Cardiac CT appointment. Be advised that routine Cardiac CT appointments could be scheduled as many as 8 weeks after your provider has ordered it.  For non-scheduling related questions, please contact the cardiac imaging nurse navigator should you have any questions/concerns: Marchia Bond, Cardiac Imaging Nurse Navigator Burley Saver, Interim Cardiac Imaging Nurse  [place] and Vascular Services Direct Office Dial: [phone]   For scheduling needs, including cancellations and rescheduling, please call Vivien Rota at [phone], option [phone].    Follow-Up: At [facility], you and your health needs are our priority.  As part of our continuing mission to provide you with exceptional heart care, we have created designated Provider Care Teams.  These Care Teams include your primary Cardiologist (physician) and Advanced Practice Providers (APPs -  Physician Assistants and Nurse Practitioners) who all work together to provide you with the care you need, when you need it.  We recommend signing up for the patient portal called "MyChart".  Sign up information is provided on this After Visit Summary.  MyChart is used to connect with patients for Virtual Visits (Telemedicine).  Patients are able to view lab/test results, encounter notes, upcoming appointments, etc.  Non-urgent messages can be sent to your provider as well.   To learn more about what you  can do with MyChart, go to [email or web address].    Your next appointment:   Will be determined after the above testing has been completed.  Thank you for choosing [facility]!!

## 2020-07-26 NOTE — Progress Notes (Signed)
Cardiology Office Note:    Date:  07/26/2020   ID:  Michelle Kramer, DOB [birth year], MRN [id number]  PCP:  Maggie Schwalbe, PA-C  [place] Cardiologist:  No primary care provider on file.  [facility] Electrophysiologist:  None   Referring MD: Maggie Schwalbe, PA-C     History of Present Illness:    Michelle Kramer is a 54 y.o. female here for the evaluation of palpitations/bradycardia/shortness of breath at the request of Renaldo Nodal, PA  Sometimes in bed at night pounding and skipping beats. Mubness in right arm and hands. DJD of cervical spine  Going up 3 flights of steps, so SOB. Has to stop when hiking.   Up until 3 months ago was exercising well. Now SOB quickly. Sometimes feels a pinch/ache in the heart, 30 seconds of pain.   No smoker. Occasional ETOH. Weekends. Sometimes more skips.  Most days no caffeine. No supplements.   Has had a few muscle cramps in calf bilaterally.   Bother boys have to be followed with ECHO. 1 had enlarged aortic root, tall no Marfans. 2. Maybe root enlarged but OK.   Birth mother had heath issues. Had AFIB and CVA, died, CHF.   An EKG from 10/04/14 in our system was reviewed and showed sinus bradycardia rate 53 with T wave inversion in both V1 and V2.  Has previously been diagnosed with mitral valve prolapse in the past.  She has had anxiety.  Past Medical History:  Diagnosis Date   Anemia    Anxiety    Chronic UTI    Headache    Heart murmur    Hypertension    only with prior OCP use   Insomnia    MVP (mitral valve prolapse)    Post-operative state - [facility] w/ bilat salpingectomy 05/22/2012   Premature ovarian failure    resolved 03/2011 with normal FSH/estradiol    Past Surgical History:  Procedure Laterality Date   ABDOMINAL HYSTERECTOMY     BUNIONECTOMY     right foot   HYSTEROSCOPY  2002   LAPAROSCOPIC SUPRACERVICAL HYSTERECTOMY  05/21/2012   Procedure: LAPAROSCOPIC SUPRACERVICAL  HYSTERECTOMY;  Surgeon: Claiborne Billings A. Pamala Hurry, MD;  Location: [facility];  Service: Gynecology;  Laterality: N/A;   ~Laprascopic Supracervical Hysterectomy Attempted, Converted into a Robotic Assisted Hysterectomy  Laprascopic Supracervical Hysterectomy with Morcellation Possible Robotic Assistance Book in Robot Room Do Not Drape Robot     Current Medications: Current Meds  Medication Sig   CALCIUM-VITAMIN D PO Take 2 tablets by mouth daily.   clonazePAM (KLONOPIN) 0.5 MG tablet Take 0.5 mg by mouth at bedtime as needed for anxiety.   estradiol (VIVELLE-DOT) 0.1 MG/24HR patch Place 1 patch onto the skin 2 (two) times a week.   Linaclotide (LINZESS) 145 MCG CAPS capsule Take 145 mcg by mouth as needed.   valACYclovir (VALTREX) 1000 MG tablet Take 1,000 mg by mouth as needed.   zolpidem (AMBIEN) 5 MG tablet Take by mouth.     Allergies:   Oxycodone-acetaminophen and Vicodin [hydrocodone-acetaminophen]   Social History   Socioeconomic History   Marital status: Single    Spouse name: Not on file   Number of children: Not on file   Years of education: Not on file   Highest education level: Not on file  Occupational History   Not on file  Tobacco Use   Smoking status: Former Smoker   Smokeless tobacco: Never Used  Substance and Sexual Activity   Alcohol use:  Yes    Comment: occasional   Drug use: No   Sexual activity: Yes    Birth control/protection: Surgical    Comment: vasectomy  Other Topics Concern   Not on file  Social History Narrative   Lives with fiance of 6 years, 3 children at home, 2 biological, 1 step, [employer], exercises some   Social Determinants of Health   Financial Resource Strain:    Difficulty of Paying Living Expenses: Not on file  Food Insecurity:    Worried About Charity fundraiser in the Last Year: Not on file   [employer] of Food in the Last Year: Not on file  Transportation Needs:    Lack of Transportation (Medical): Not on file    Lack of Transportation (Non-Medical): Not on file  Physical Activity:    Days of Exercise per Week: Not on file   Minutes of Exercise per Session: Not on file  Stress:    Feeling of Stress : Not on file  Social Connections:    Frequency of Communication with Friends and Family: Not on file   Frequency of Social Gatherings with Friends and Family: Not on file   Attends Religious Services: Not on file   Active Member of Clubs or Organizations: Not on file   Attends Archivist Meetings: Not on file   Marital Status: Not on file     Family History: The patient's family history includes Heart disease in her maternal grandmother; Hypertension in her maternal grandmother and mother; Lung cancer in her mother; Lupus in her sister. She was adopted.  ROS:   Please see the history of present illness.    Denies any fevers chills nausea vomiting bleeding syncope orthopnea PND all other systems reviewed and are negative.  EKGs/Labs/Other Studies Reviewed:     EKG:  EKG is  ordered today.  The ekg ordered today demonstrates sinus bradycardia rate 59 with no other abnormalities.  Prior EKG reviewed as above.  Recent Labs: No results found for requested labs within last 8760 hours.  Recent Lipid Panel No results found for: CHOL, TRIG, HDL, CHOLHDL, VLDL, LDLCALC, LDLDIRECT   Risk Assessment/Calculations:       Physical Exam:    VS:  BP 126/76    Pulse (!) 59    Ht $R'5\' 5"'Kt$  (1.651 m)    Wt 113 lb 9.6 oz (51.5 kg)    LMP 05/09/2012    SpO2 99%    BMI 18.90 kg/m     Wt Readings from Last 3 Encounters:  07/26/20 113 lb 9.6 oz (51.5 kg)  09/06/14 109 lb 2 oz (49.5 kg)  03/12/14 110 lb 12 oz (50.2 kg)     GEN:  Well nourished, well developed in no acute distress, thin HEENT: Normal NECK: No JVD; No carotid bruits LYMPHATICS: No lymphadenopathy CARDIAC: RRR, no murmurs, rubs, gallops, rare ectopy RESPIRATORY:  Clear to auscultation without rales, wheezing or rhonchi   ABDOMEN: Soft, non-tender, non-distended MUSCULOSKELETAL:  No edema; No deformity  SKIN: Warm and dry NEUROLOGIC:  Alert and oriented x 3 PSYCHIATRIC:  Normal affect   ASSESSMENT:    1. Palpitations   2. Precordial pain   3. Chest pain of uncertain etiology   4. Dyspnea on exertion    PLAN:    In order of problems listed above:  Palpitations -We will check a Zio patch monitor for 14 days.  By description, sounds like PVCs or PACs which can occur at night when getting ready  for bed for instance.  If these are the case, benign.  Usually we do not need medications for these.  Dyspnea on exertion/atypical chest discomfort -We will check an echocardiogram to ensure proper structure and function of her heart.  This has been getting worse over the past 3 months.  If unremarkable, pulmonary evaluation may be warranted. -We will go ahead and check a coronary CT scan as well to make sure that there is no signs of atherosclerosis that could be manifesting itself with shortness of breath as an anginal equivalent.  If atherosclerosis is present, this would change her prevention strategy, statin use for instance might be warranted.  Her last LDL was 86 from outside labs hemoglobin A1c 5.6 ALT 11 potassium 4.4 -Interestingly, one of her sons as noted above has a dilated aortic root.  They tested him for Marfan's but he does not have all the qualifiers.  We will follow-up with results of study.    Medication Adjustments/Labs and Tests Ordered: Current medicines are reviewed at length with the patient today.  Concerns regarding medicines are outlined above.  Orders Placed This Encounter  Procedures   CT CORONARY MORPH W/CTA COR W/SCORE W/CA W/CM &/OR WO/CM   CT CORONARY FRACTIONAL FLOW RESERVE DATA PREP   CT CORONARY FRACTIONAL FLOW RESERVE FLUID ANALYSIS   LONG TERM MONITOR (3-14 DAYS)   EKG 12-Lead   ECHOCARDIOGRAM COMPLETE   Meds ordered this encounter  Medications   metoprolol  tartrate (LOPRESSOR) 100 MG tablet    Sig: Take 1 tablet (100 mg total) by mouth once for 1 dose. Take 1 tablet 2 hours before your CT scan    Dispense:  1 tablet    Refill:  0    Patient Instructions  Medication Instructions:  The current medical regimen is effective;  continue present plan and medications.  *If you need a refill on your cardiac medications before your next appointment, please call your pharmacy*  Testing/Procedures: Your physician has requested that you have an echocardiogram. Echocardiography is a painless test that uses sound waves to create images of your heart. It provides your doctor with information about the size and shape of your heart and how well your hearts chambers and valves are working. This procedure takes approximately one hour. There are no restrictions for this procedure.  ZIO XT- Long Term Monitor Instructions   Your physician has requested you wear your ZIO patch monitor 14 days.   This is a single patch monitor.  Irhythm supplies one patch monitor per enrollment.  Additional stickers are not available.   Please do not apply patch if you will be having a Nuclear Stress Test, Echocardiogram, Cardiac CT, MRI, or Chest Xray during the time frame you would be wearing the monitor. The patch cannot be worn during these tests.  You cannot remove and re-apply the ZIO XT patch monitor.   Your ZIO patch monitor will be sent USPS Priority mail from [facility] directly to your home address. The monitor may also be mailed to a PO BOX if home delivery is not available.   It may take 3-5 days to receive your monitor after you have been enrolled.   Once you have received you monitor, please review enclosed instructions.  Your monitor has already been registered assigning a specific monitor serial # to you.   Applying the monitor   Shave hair from upper left chest.   Hold abrader disc by orange tab.  Rub abrader in 40 strokes over left  upper chest as  indicated in your monitor instructions.   Clean area with 4 enclosed alcohol pads .  Use all pads to assure are is cleaned thoroughly.  Let dry.   Apply patch as indicated in monitor instructions.  Patch will be place under collarbone on left side of chest with arrow pointing upward.   Rub patch adhesive wings for 2 minutes.Remove white label marked "1".  Remove white label marked "2".  Rub patch adhesive wings for 2 additional minutes.   While looking in a mirror, press and release button in center of patch.  A small green light will flash 3-4 times .  This will be your only indicator the monitor has been turned on.     Do not shower for the first 24 hours.  You may shower after the first 24 hours.   Press button if you feel a symptom. You will hear a small click.  Record Date, Time and Symptom in the Patient Log Book.   When you are ready to remove patch, follow instructions on last 2 pages of Patient Log Book.  Stick patch monitor onto last page of Patient Log Book.   Place Patient Log Book in [place] box.  Use locking tab on box and tape box closed securely.  The Orange and [employer] has [employer] on it.  Please place in mailbox as soon as possible.  Your physician should have your test results approximately 7 days after the monitor has been mailed back to [facility].   Call [place] at [phone] if you have questions regarding your ZIO XT patch monitor.  Call them immediately if you see an orange light blinking on your monitor.   If your monitor falls off in less than 4 days contact our Monitor department at [phone].  If your monitor becomes loose or falls off after 4 days call [employer] at [phone] for suggestions on securing your monitor.   Your cardiac CT will be scheduled at:   [facility] [address] [place], [place] [address] [phone]  Please arrive at the [facility] main entrance of [facility] 30 minutes  prior to test start time. Proceed to the [facility] Radiology Department (first floor) to check-in and test prep.  Please follow these instructions carefully (unless otherwise directed):  Hold all erectile dysfunction medications at least 3 days (72 hrs) prior to test.  On the Night Before the Test:  Be sure to Drink plenty of water.  Do not consume any caffeinated/decaffeinated beverages or chocolate 12 hours prior to your test.  Do not take any antihistamines 12 hours prior to your test.  On the Day of the Test:  Drink plenty of water. Do not drink any water within one hour of the test.  Do not eat any food 4 hours prior to the test.  You may take your regular medications prior to the test.   Take metoprolol (Lopressor) two hours prior to test.  HOLD Furosemide/Hydrochlorothiazide morning of the test.  FEMALES- please wear underwire-free bra if available  After the Test:  Drink plenty of water.  After receiving IV contrast, you may experience a mild flushed feeling. This is normal.  On occasion, you may experience a mild rash up to 24 hours after the test. This is not dangerous. If this occurs, you can take Benadryl 25 mg and increase your fluid intake.  If you experience trouble breathing, this can be serious. If it is severe  call 911 IMMEDIATELY. If it is mild, please call our office.  If you take any of these medications: Glipizide/Metformin, Avandament, Glucavance, please do not take 48 hours after completing test unless otherwise instructed.   Once we have confirmed authorization from your insurance company, we will call you to set up a date and time for your test. Based on how quickly your insurance processes prior authorizations requests, please allow up to 4 weeks to be contacted for scheduling your Cardiac CT appointment. Be advised that routine Cardiac CT appointments could be scheduled as many as 8 weeks after your provider has ordered it.  For non-scheduling  related questions, please contact the cardiac imaging nurse navigator should you have any questions/concerns: Marchia Bond, Cardiac Imaging Nurse Navigator Burley Saver, Interim Cardiac Imaging Nurse [place] and Vascular Services Direct Office Dial: [phone]   For scheduling needs, including cancellations and rescheduling, please call Vivien Rota at [phone], option 3.    Follow-Up: At [facility], you and your health needs are our priority.  As part of our continuing mission to provide you with exceptional heart care, we have created designated Provider Care Teams.  These Care Teams include your primary Cardiologist (physician) and Advanced Practice Providers (APPs -  Physician Assistants and Nurse Practitioners) who all work together to provide you with the care you need, when you need it.  We recommend signing up for the patient portal called "MyChart".  Sign up information is provided on this After Visit Summary.  MyChart is used to connect with patients for Virtual Visits (Telemedicine).  Patients are able to view lab/test results, encounter notes, upcoming appointments, etc.  Non-urgent messages can be sent to your provider as well.   To learn more about what you can do with MyChart, go to [email or web address].    Your next appointment:   Will be determined after the above testing has been completed.  Thank you for choosing [facility]!!         Signed, Candee Furbish, MD  07/26/2020 10:18 AM    [place]

## 2020-07-29 ENCOUNTER — Other Ambulatory Visit (INDEPENDENT_AMBULATORY_CARE_PROVIDER_SITE_OTHER): Payer: BC Managed Care – PPO

## 2020-07-29 DIAGNOSIS — R079 Chest pain, unspecified: Secondary | ICD-10-CM | POA: Diagnosis not present

## 2020-07-29 DIAGNOSIS — R002 Palpitations: Secondary | ICD-10-CM

## 2020-08-13 ENCOUNTER — Other Ambulatory Visit (HOSPITAL_COMMUNITY): Payer: BC Managed Care – PPO

## 2020-08-26 ENCOUNTER — Telehealth: Payer: Self-pay | Admitting: Cardiology

## 2020-08-26 NOTE — Telephone Encounter (Signed)
Patient is following up regarding her holter monitor. She states she sent it back over a week ago and she would like to ensure than it was worn correctly since she has not heard back from our office regarding results. I made her aware that I do not show we have the results in just yet and the holiday may have caused a delay. Patient verbalized understanding. However, she requested a call back to verify that she initially applied it correctly.

## 2020-08-26 NOTE — Telephone Encounter (Signed)
Called to speak with pt regarding her monitor results.  LM the company (per Theodoro Kos) has received the monitor and is working on loading the information into the computer to be sent to Dr Anne Fu to read.  Advised once this has been completed, we will contact her with the results.

## 2020-08-27 DIAGNOSIS — R002 Palpitations: Secondary | ICD-10-CM | POA: Diagnosis not present

## 2020-08-27 DIAGNOSIS — R079 Chest pain, unspecified: Secondary | ICD-10-CM | POA: Diagnosis not present

## 2020-08-31 ENCOUNTER — Telehealth: Payer: Self-pay | Admitting: Cardiology

## 2020-08-31 MED ORDER — METOPROLOL SUCCINATE ER 25 MG PO TB24: 25.0000 mg | ORAL_TABLET | Freq: Every day | ORAL | 6 refills | Status: DC

## 2020-08-31 NOTE — Telephone Encounter (Signed)
    Pt is returning Pam's call regarding her heart monitor results

## 2020-08-31 NOTE — Telephone Encounter (Signed)
   Sinus rhythm with average heart rate 58 bpm.  There is a 9 beat run of ventricular tachycardia occurring at 5 AM with average heart rate of 159 bpm  There are multiple episodes of short runs of paroxysmal supraventricular tachycardia, appear to be paroxysmal atrial tachycardia with the fastest at 9 beats duration, 174 bpm, the longest 15.4 seconds duration at 98 bpm average.  There were occasional PACs.  Echocardiogram as well as coronary CT currently pending. These will take place in early January 2022.  These further testing will help guide management. For now let us start Toprol-XL low-dose 25 mg a day and continue with conservative strategy such as decreased caffeine, avoiding decongestants.  Donato Schultz, MD   Sharin Grave, RN  08/31/2020 6:03 PM EST     Reviewed results and Dr. Anne Fu treatment recommendations with pt in detail. RX for metoprolol succinate will be sent electronically to [facility] [address] at pt's request. She will keep upcoming scheduled appts and c/b if any questions/concerns.

## 2020-09-22 DIAGNOSIS — R350 Frequency of micturition: Secondary | ICD-10-CM | POA: Diagnosis not present

## 2020-09-22 DIAGNOSIS — R82998 Other abnormal findings in urine: Secondary | ICD-10-CM | POA: Diagnosis not present

## 2020-09-22 DIAGNOSIS — N390 Urinary tract infection, site not specified: Secondary | ICD-10-CM | POA: Diagnosis not present

## 2020-10-01 ENCOUNTER — Ambulatory Visit (HOSPITAL_COMMUNITY): Payer: BC Managed Care – PPO | Attending: Cardiovascular Disease

## 2020-10-01 ENCOUNTER — Other Ambulatory Visit: Payer: Self-pay

## 2020-10-01 DIAGNOSIS — R002 Palpitations: Secondary | ICD-10-CM

## 2020-10-01 DIAGNOSIS — R079 Chest pain, unspecified: Secondary | ICD-10-CM

## 2020-10-01 LAB — ECHOCARDIOGRAM COMPLETE
Area-P 1/2: 2.32 cm2
S' Lateral: 3.2 cm

## 2020-10-07 ENCOUNTER — Telehealth (HOSPITAL_COMMUNITY): Payer: Self-pay | Admitting: *Deleted

## 2020-10-07 NOTE — Telephone Encounter (Signed)
Reaching out to patient to offer assistance regarding upcoming cardiac imaging study; pt verbalizes understanding of appt date/time, parking situation and where to check in, pre-test NPO status and medications ordered, and verified current allergies; name and call back number provided for further questions should they arise  Hillari Zumwalt RN Navigator Cardiac Imaging [facility] [phone] office [phone] cell  

## 2020-10-08 ENCOUNTER — Encounter: Payer: BC Managed Care – PPO | Admitting: *Deleted

## 2020-10-08 ENCOUNTER — Other Ambulatory Visit: Payer: Self-pay

## 2020-10-08 ENCOUNTER — Ambulatory Visit (HOSPITAL_COMMUNITY)
Admission: RE | Admit: 2020-10-08 | Discharge: 2020-10-08 | Disposition: A | Discharge status: Home / Self Care | Payer: BC Managed Care – PPO | Source: Ambulatory Visit | Attending: Cardiology | Admitting: Cardiology

## 2020-10-08 DIAGNOSIS — Z006 Encounter for examination for normal comparison and control in clinical research program: Secondary | ICD-10-CM

## 2020-10-08 DIAGNOSIS — R072 Precordial pain: Secondary | ICD-10-CM

## 2020-10-08 DIAGNOSIS — R079 Chest pain, unspecified: Secondary | ICD-10-CM | POA: Diagnosis not present

## 2020-10-08 IMAGING — CT CT HEART MORP W/ CTA COR W/ SCORE W/ CA W/CM &/OR W/O CM
4 of 7 series · 8 of 20 positions shown, 9 images · non-contrast
Comparison: None.
COMPARISON: None.

Addendum:
EXAM:
OVER-READ INTERPRETATION  CT CHEST

The following report is an over-read performed by radiologist Dr.
DAWNE [REDACTED] on [DATE]. This
over-read does not include interpretation of cardiac or coronary
anatomy or pathology. The coronary calcium score/coronary CTA
interpretation by the cardiologist is attached.
CLINICAL DATA: 54F with hypertension, mitral valve prolapse and
exertional chest pain and shortness of breath.
Cardiac/Coronary  CT
TECHNIQUE: The patient was scanned on a Phillips Force scanner.

[Series 6: best diast 76 % · axial · 0.36mm/px · z∈[-376,-324]mm · 2 of 392 slices shown, 3 images]
[im 131/392  vessel]
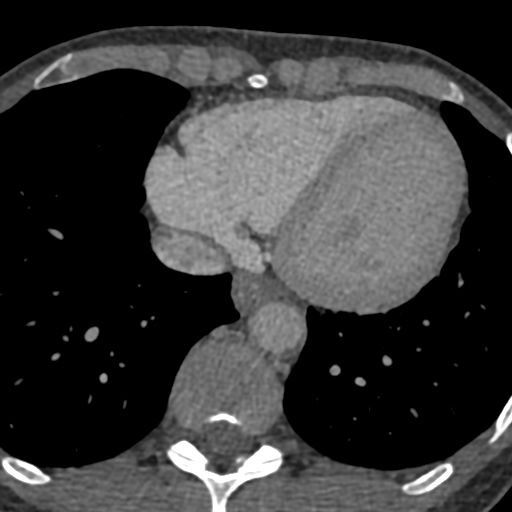
[im 131/392  lung]
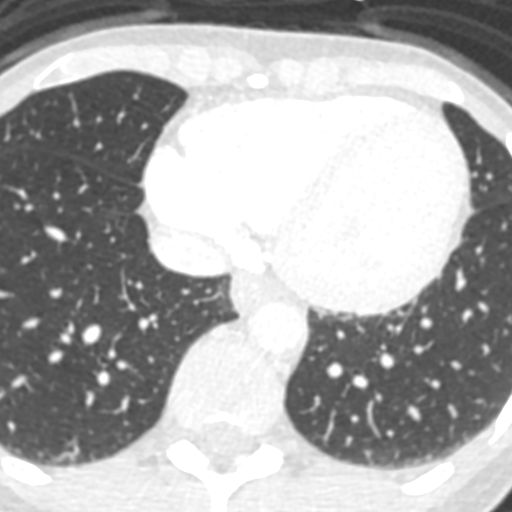
[im 261/392  vessel]
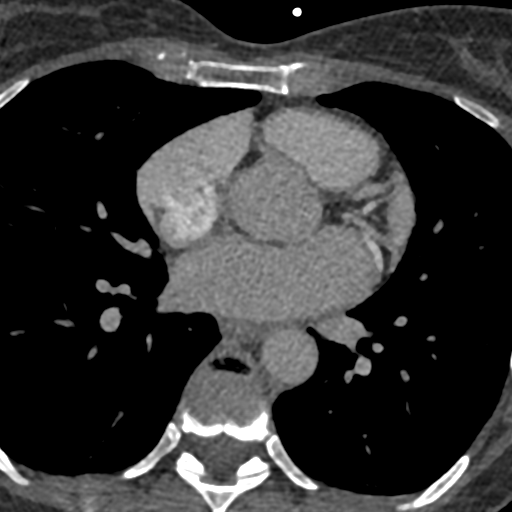

[Series 7: best syst 40 % · axial · 0.36mm/px · z∈[-376,-324]mm · 2 of 392 slices shown]
[im 131/392  vessel]
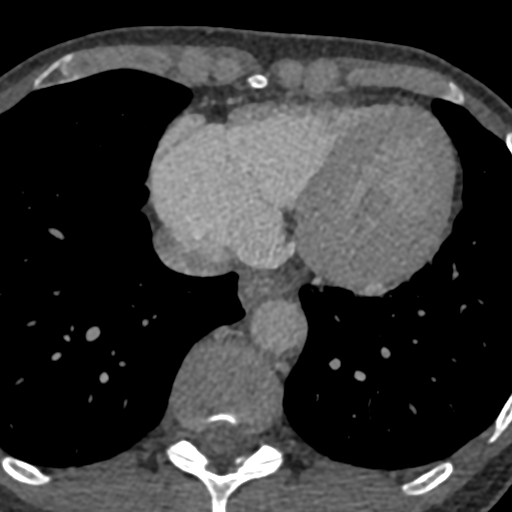
[im 261/392  vessel]
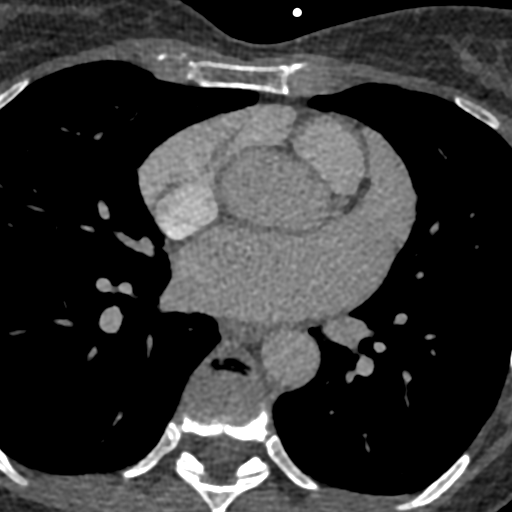

[Series 8: ts diast sharp 76 % · axial · 0.36mm/px · z∈[-376,-324]mm · 2 of 392 slices shown]
[im 131/392  lung]
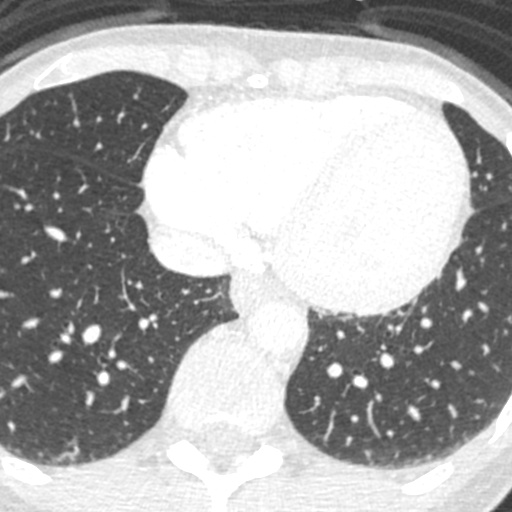
[im 261/392  lung]
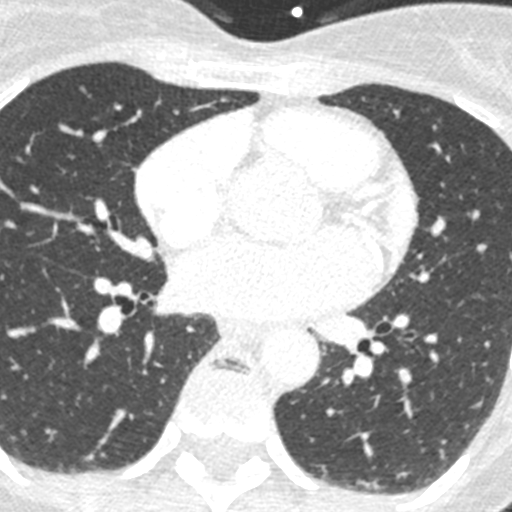

[Series 9: ts syst sharp 40 % · axial · 0.36mm/px · z∈[-376,-324]mm · 2 of 392 slices shown]
[im 131/392  lung]
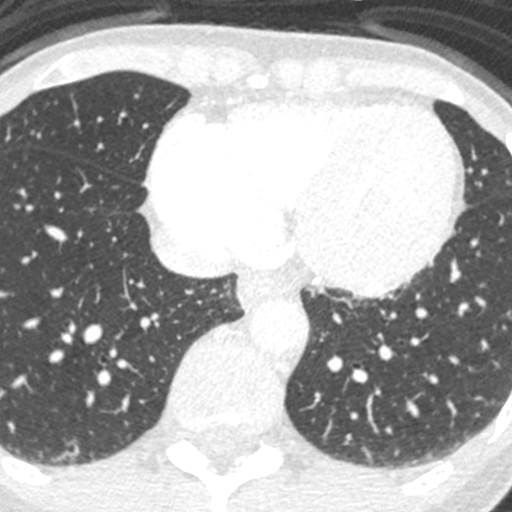
[im 261/392  lung]
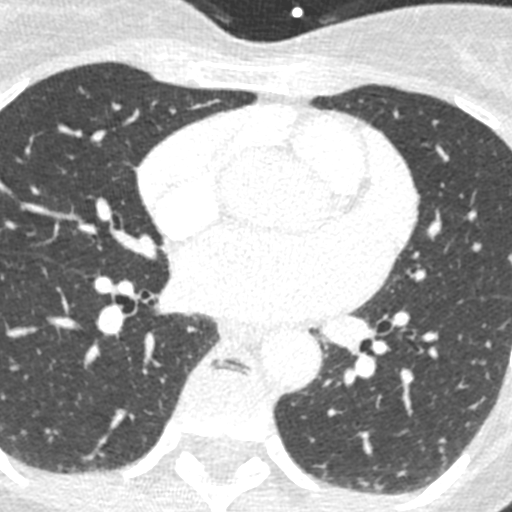

[8 of 20 positions shown; findings below may reference images not displayed]

FINDINGS: Within the visualized portions of the thorax there are no suspicious
appearing pulmonary nodules or masses, there is no acute
consolidative airspace disease, no pleural effusions, no
pneumothorax and no lymphadenopathy. Visualized portions of the
upper abdomen are unremarkable. There are no aggressive appearing
lytic or blastic lesions noted in the visualized portions of the
skeleton.
IMPRESSION: No significant incidental noncardiac findings are noted.
FINDINGS: A 120 kV prospective scan was triggered in the descending thoracic
aorta at 111 HU's. Axial non-contrast 3 mm slices were carried out
through the heart. The data set was analyzed on a dedicated work
station and scored using the Agatson method. Gantry rotation speed
was 250 msecs and collimation was .6 mm. No beta blockade and 0.8 mg
of sl NTG was given. The 3D data set was reconstructed in 5%
intervals of the 67-82 % of the R-R cycle. Diastolic phases were
analyzed on a dedicated work station using MPR, MIP and VRT modes.
The patient received 80 cc of contrast.

Aorta: Normal size.  No calcifications.  No dissection.

Aortic Valve:  Trileaflet.  No calcifications.

Coronary Arteries:  Normal coronary origin.  Right dominance.

RCA is a large artery.  There is no plaque.

Left main is a large artery that gives rise to LAD and LCX arteries.

LAD is a large vessel that has no plaque.

LCX is a small artery with no plaque.

Other findings:

Normal pulmonary vein drainage into the left atrium.

Normal let atrial appendage without a thrombus.

Normal size of the pulmonary artery.

The timing of the contrast bolus is poor. Unable to fully visualize
distal vessels.
IMPRESSION: 1. Coronary calcium score of 0. This was 0 percentile for age and
sex matched control.

2. Normal coronary origin.

3. No evidence of CAD.

4. The timing of the contrast bolus is poor. Unable to fully
visualize distal vessels. Given that there are no coronary
calcifications, it is unlikely that there are any significant
stenoses. However, given that the mid to distal arteries are not
well-opacified, cannot fully rule out coronary artery disease.

5.  Consider non-cardiac causes of chest pain.

*** End of Addendum ***
EXAM:
OVER-READ INTERPRETATION  CT CHEST

The following report is an over-read performed by radiologist Dr.
DAWNE [REDACTED] on [DATE]. This
over-read does not include interpretation of cardiac or coronary
anatomy or pathology. The coronary calcium score/coronary CTA
interpretation by the cardiologist is attached.
FINDINGS: Within the visualized portions of the thorax there are no suspicious
appearing pulmonary nodules or masses, there is no acute
consolidative airspace disease, no pleural effusions, no
pneumothorax and no lymphadenopathy. Visualized portions of the
upper abdomen are unremarkable. There are no aggressive appearing
lytic or blastic lesions noted in the visualized portions of the
skeleton.
IMPRESSION: No significant incidental noncardiac findings are noted.

## 2020-10-08 MED ORDER — NITROGLYCERIN 0.4 MG SL SUBL
0.8000 mg | SUBLINGUAL_TABLET | Freq: Once | SUBLINGUAL | Status: AC
  Administered 2020-10-08: 0.8 mg via SUBLINGUAL

## 2020-10-08 MED ORDER — IOHEXOL 350 MG/ML SOLN
80.0000 mL | Freq: Once | INTRAVENOUS | Status: AC | PRN
  Administered 2020-10-08: 80 mL via INTRAVENOUS

## 2020-10-08 MED ORDER — NITROGLYCERIN 0.4 MG SL SUBL
SUBLINGUAL_TABLET | SUBLINGUAL | Status: AC
  Filled 2020-10-08: qty 2

## 2020-10-08 NOTE — Research (Signed)
Subject Name: Michelle Kramer  Subject met inclusion and exclusion criteria.  The informed consent form, study requirements and expectations were reviewed with the subject and questions and concerns were addressed prior to the signing of the consent form.  The subject verbalized understanding of the trial requirements.  The subject agreed to participate in the IDENTIFY trial and signed the informed consent at 1256 on 10/08/20  The informed consent was obtained prior to performance of any protocol-specific procedures for the subject.  A copy of the signed informed consent was given to the subject and a copy was placed in the subject's medical record.   Timoteo Gaul

## 2020-10-09 MED ORDER — PHENYLEPHRINE HCL-NACL 10-0.9 MG/250ML-% IV SOLN
INTRAVENOUS | Status: AC
  Filled 2020-10-09: qty 250

## 2020-10-11 ENCOUNTER — Telehealth: Payer: Self-pay | Admitting: Cardiology

## 2020-10-11 NOTE — Telephone Encounter (Signed)
Patient returning a call about her CT results. Please call back

## 2020-10-11 NOTE — Telephone Encounter (Signed)
Jake Bathe, MD  10/11/2020 8:33 AM EST      Overall reassuring CT scan of heart. No evidence of coronary plaque in the visualized vessels. Normal aortic size Donato Schultz, MD     The patient has been notified of the result and verbalized understanding.  All questions (if any) were answered. Theresia Majors, RN 10/11/2020 9:57 AM

## 2020-10-14 ENCOUNTER — Telehealth: Payer: Self-pay | Admitting: *Deleted

## 2020-10-14 DIAGNOSIS — R0609 Other forms of dyspnea: Secondary | ICD-10-CM

## 2020-10-14 DIAGNOSIS — R0600 Dyspnea, unspecified: Secondary | ICD-10-CM

## 2020-10-14 NOTE — Telephone Encounter (Signed)
   Please refer to pulmonary  Thanks   Donato Schultz, MD   Message text    Leanord Hawking, RN routed conversation to You; Jake Bathe, MD 23 hours ago (9:48 AM)   Beckie Kramer, Michelle Kramer, Michelle Fells, MD 23 hours ago (9:47 AM)   Hi Dr. Anne Fu, So happy (and relieved) to hear the good news of my tests!  Thank you for your message regarding my echo!    I was wondering 2 things:   1) Do I need to follow up with you in regards to the irregular heartbeat? and since I have been put on a new medication, the metoprolol?  2) Since it was shortness of breath that brought me to you to begin with, could you refer me to a pulmonologist or whomever I might need to see next to figure out what could be causing it?  Thank you, Michelle Kramer

## 2020-11-10 ENCOUNTER — Encounter: Payer: Self-pay | Admitting: Pulmonary Disease

## 2020-11-10 ENCOUNTER — Ambulatory Visit: Payer: BC Managed Care – PPO | Admitting: Pulmonary Disease

## 2020-11-10 ENCOUNTER — Other Ambulatory Visit: Payer: Self-pay

## 2020-11-10 VITALS — BP 116/74 | HR 63 | Temp 97.4°F | Ht 65.0 in | Wt 112.3 lb

## 2020-11-10 DIAGNOSIS — I5189 Other ill-defined heart diseases: Secondary | ICD-10-CM | POA: Diagnosis not present

## 2020-11-10 DIAGNOSIS — R0600 Dyspnea, unspecified: Secondary | ICD-10-CM | POA: Diagnosis not present

## 2020-11-10 DIAGNOSIS — R0609 Other forms of dyspnea: Secondary | ICD-10-CM

## 2020-11-10 NOTE — Patient Instructions (Addendum)
Nice to meet you!  We will get breathing or pulmonary function tests in the coming weeks to further define how the lungs may be contributing to your symptoms. Based on results, I anticipate recommending a trial of an inhaler to see if it helps your breathing.   We have many options to try to help in the future if these interventions do not help.   Schedule PFTs at patient convenience in next 1-3 weeks  Return for follow up with Dr. Judeth Horn in 3 months or sooner as needed. Feel free to send me a message with any questions or concerns. Happy to help in any way.

## 2020-11-18 DIAGNOSIS — S60222A Contusion of left hand, initial encounter: Secondary | ICD-10-CM | POA: Diagnosis not present

## 2020-11-22 NOTE — Progress Notes (Signed)
@Patient  ID: , female    DOB: 1966-09-08, 55 y.o.   MRN: [id number]  Chief Complaint  Patient presents with  . Consult    Referred by cardiologist for DOE. States this has been going on for the past 2 years but has recently gotten worse in the last year. Has some chest tightness on the left side with the SOB episodes.     Referring provider: [id number], PA-C  HPI:   55 year old whom we are seen in consultation for evaluation of dyspnea on exertion.  Note from referring provider reviewed.  Recent cardiology note reviewed.  Reviewed results of CT coronary as well as echocardiogram.  She no dyspnea exertion for many weeks to months now.  Worse with inclines or stairs.  Usually okay on flat surfaces, day-to-day work.  First noticed it when going up stairs at work that taking the 2 flights of stairs really wore her out above and beyond what is normal.  Took a long time to recover in terms of her breathing standpoint.  She does have seasonal allergies.  She thinks that this shortness of breath may be a bit worse when her allergy symptoms are present.  Otherwise no environmental factors that she can identify, not better or worse at home compared to work etc.  The recent changes in her home, remodels, move etc.  No timing during the day was better or worse.  No other alleviating or exacerbating factors.  She has not used any inhalers.  Work-up began with cardiology evaluation.  Echocardiogram revealed grade 2 diastolic dysfunction, estimated elevated RA pressure of 15.  Cardiac monitor revealed rare SVT.  CT of coronary showed no calcium in coronary arteries.  Lung images personally viewed and interpreted as clear lungs, no infiltrate, effusion, initial changes etc.  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  MMRC: mMRC Dyspnea Scale mMRC Score  11/10/2020 1    Epworth:  No flowsheet data found.  Tests:   FENO:  No results found for: NITRICOXIDE  PFT: No flowsheet data found.  WALK:  No flowsheet data found.  Imaging: Personally reviewed and as per EMR discussion this note  Lab Results:  CBC    Component Value Date/Time   WBC 14.5 (H) 02/26/2014 1711   RBC 4.40 02/26/2014 1711   HGB 12.3 02/26/2014 1711   HCT 36.3 02/26/2014 1711   PLT 356 02/26/2014 1711   MCV 82.5 02/26/2014 1711   MCH 28.0 02/26/2014 1711   MCHC 33.9 02/26/2014 1711   RDW 13.7 02/26/2014 1711   LYMPHSABS 0.4 (L) 02/08/2014 1030   MONOABS 0.3 02/08/2014 1030   EOSABS 0.2 02/08/2014 1030   BASOSABS 0.0 02/08/2014 1030    BMET    Component Value Date/Time   NA 138 02/26/2014 1711   K 4.3 02/26/2014 1711   CL 101 02/26/2014 1711   CO2 28 02/26/2014 1711   GLUCOSE 73 02/26/2014 1711   BUN 19 02/26/2014 1711   CREATININE 0.83 02/26/2014 1711   CALCIUM 9.8 02/26/2014 1711   GFRNONAA 85 (L) 02/08/2014 1030   GFRAA >90 02/08/2014 1030    BNP No results found for: BNP  ProBNP No results found for: PROBNP  Specialty Problems      Pulmonary Problems  Acute upper respiratory infection      Allergies  Allergen Reactions  . Oxycodone-Acetaminophen Nausea And Vomiting    Extreme nausea & vomiting  . Vicodin [Hydrocodone-Acetaminophen] Nausea And Vomiting    Extreme nausea & vomiting     There is no immunization history on file for this patient.  Past Medical History:  Diagnosis Date  . Anemia   . Anxiety   . Chronic UTI   . Headache   . Heart murmur   . Hypertension    only with prior OCP use  . Insomnia   . MVP (mitral valve prolapse)   . Post-operative state [facility] w/ bilat salpingectomy 05/22/2012  . Premature ovarian failure    resolved 03/2011 with normal  FSH/estradiol    Tobacco History: Social History   Tobacco Use  Smoking Status Former Smoker  Smokeless Tobacco Never Used   Counseling given: Not Answered   Continue to not smoke  Outpatient Encounter Medications as of 11/10/2020  Medication Sig  . CALCIUM-VITAMIN D PO Take 2 tablets by mouth daily.  . clonazePAM (KLONOPIN) 0.5 MG tablet Take 0.5 mg by mouth at bedtime as needed for anxiety.  Marland Kitchen estradiol (VIVELLE-DOT) 0.1 MG/24HR patch Place 1 patch onto the skin 2 (two) times a week.  . linaclotide (LINZESS) 145 MCG CAPS capsule Take 145 mcg by mouth as needed.  . metoprolol succinate (TOPROL-XL) 25 MG 24 hr tablet Take 1 tablet (25 mg total) by mouth daily.  . valACYclovir (VALTREX) 1000 MG tablet Take 1,000 mg by mouth as needed.  . zolpidem (AMBIEN) 5 MG tablet Take by mouth.  . metoprolol tartrate (LOPRESSOR) 100 MG tablet Take 1 tablet (100 mg total) by mouth once for 1 dose. Take 1 tablet 2 hours before your CT scan   No facility-administered encounter medications on file as of 11/10/2020.     Review of Systems  Review of Systems  Chest pain with exertion.  No orthopnea or PND.  Does note some lower extremity swelling over the last few weeks to months.  Comprehensive review of systems otherwise negative. Physical Exam  BP 116/74   Pulse 63   Temp (!) 97.4 F (36.3 C) (Temporal)   Ht 5\' 5"  (1.651 m)   Wt 112 lb 4.8 oz (50.9 kg)   LMP 05/09/2012   SpO2 99% Comment: on RA  BMI 18.69 kg/m   Wt Readings from Last 5 Encounters:  11/10/20 112 lb 4.8 oz (50.9 kg)  07/26/20 113 lb 9.6 oz (51.5 kg)  09/06/14 109 lb 2 oz (49.5 kg)  03/12/14 110 lb 12 oz (50.2 kg)  02/26/14 110 lb (49.9 kg)    BMI Readings from Last 5 Encounters:  11/10/20 18.69 kg/m  07/26/20 18.90 kg/m  09/06/14 18.73 kg/m  03/12/14 19.01 kg/m  02/26/14 18.30 kg/m     Physical Exam General: Well-appearing, in no acute distress Eyes: EOMI, no icterus Neck: Supple, JVP visible 3 to 4  cm above the clavicle with head of bed 90 degrees, sitting upright Cardiovascular: Regular rhythm, no murmurs Pulmonary: Clear oxygen bilaterally, no wheezes or crackles Abdomen: Nondistended, bowel sounds present MSK: No synovitis, no joint effusion Neuro: Normal gait, no weakness Psych: Normal mood, full affect   Assessment & Plan:   DOE: Recent chest images 10/08/2020 from CT chest from coronary CT demonstrates clear lungs.  Possible reactive airway/asthma.  Think significant contribution from grade 2 diastolic dysfunction with worsening diastology with exercise causing transient elevated left atrial pressures and thus  pulmonary edema and decreased CO compared to what is required.  Evidence of chronic volume overload with RA pressure estimated at 15 on recent echocardiogram.  Will obtain PFTs in the coming weeks to evaluate pulmonary function.  If evidence of active reactive airways disease would indicate trial of ICS/LABA would very well be beneficial.  Even the test is normal, would probably recommend trial of inhaler.  However if there is no symptomatic improvement, will be left with conclusion that lungs are not the largest contributor to her symptoms and further cardiac evaluation is warranted.  Diastolic dysfunction: Suspect largest driver of dyspnea exertion.  No overt or frank heart failure symptoms or signs on exam at this time.  May benefit from diuretic but defer to cardiology. Ongoing pulmonary work-up as above.   Return in about 3 months (around 02/07/2021).   Karren Burly, MD 11/22/2020

## 2020-12-02 DIAGNOSIS — S60212D Contusion of left wrist, subsequent encounter: Secondary | ICD-10-CM | POA: Diagnosis not present

## 2020-12-02 DIAGNOSIS — S60222A Contusion of left hand, initial encounter: Secondary | ICD-10-CM | POA: Diagnosis not present

## 2020-12-23 DIAGNOSIS — S60212D Contusion of left wrist, subsequent encounter: Secondary | ICD-10-CM | POA: Diagnosis not present

## 2020-12-23 DIAGNOSIS — M79642 Pain in left hand: Secondary | ICD-10-CM | POA: Diagnosis not present

## 2020-12-23 DIAGNOSIS — S60222D Contusion of left hand, subsequent encounter: Secondary | ICD-10-CM | POA: Diagnosis not present

## 2020-12-24 ENCOUNTER — Other Ambulatory Visit: Payer: Self-pay | Admitting: Cardiology

## 2021-01-21 ENCOUNTER — Encounter: Payer: Self-pay | Admitting: Pulmonary Disease

## 2021-01-21 ENCOUNTER — Other Ambulatory Visit: Payer: Self-pay

## 2021-01-21 ENCOUNTER — Ambulatory Visit (INDEPENDENT_AMBULATORY_CARE_PROVIDER_SITE_OTHER): Payer: BC Managed Care – PPO | Admitting: Pulmonary Disease

## 2021-01-21 VITALS — BP 118/68 | HR 60 | Temp 97.9°F | Ht 64.0 in | Wt 116.0 lb

## 2021-01-21 DIAGNOSIS — I5189 Other ill-defined heart diseases: Secondary | ICD-10-CM

## 2021-01-21 DIAGNOSIS — R0609 Other forms of dyspnea: Secondary | ICD-10-CM

## 2021-01-21 DIAGNOSIS — R0600 Dyspnea, unspecified: Secondary | ICD-10-CM

## 2021-01-21 LAB — PULMONARY FUNCTION TEST
DL/VA % pred: 90 %
DL/VA: 3.88 ml/min/mmHg/L
DLCO cor % pred: 93 %
DLCO cor: 19.37 ml/min/mmHg
DLCO unc % pred: 93 %
DLCO unc: 19.37 ml/min/mmHg
FEF 25-75 Post: 2.61 L/sec
FEF 25-75 Pre: 2.11 L/sec
FEF2575-%Change-Post: 23 %
FEF2575-%Pred-Post: 98 %
FEF2575-%Pred-Pre: 79 %
FEV1-%Change-Post: 5 %
FEV1-%Pred-Post: 100 %
FEV1-%Pred-Pre: 95 %
FEV1-Post: 2.76 L
FEV1-Pre: 2.62 L
FEV1FVC-%Change-Post: 4 %
FEV1FVC-%Pred-Pre: 94 %
FEV6-%Change-Post: 0 %
FEV6-%Pred-Post: 103 %
FEV6-%Pred-Pre: 103 %
FEV6-Post: 3.48 L
FEV6-Pre: 3.48 L
FEV6FVC-%Change-Post: 0 %
FEV6FVC-%Pred-Post: 102 %
FEV6FVC-%Pred-Pre: 102 %
FVC-%Change-Post: 0 %
FVC-%Pred-Post: 100 %
FVC-%Pred-Pre: 100 %
FVC-Post: 3.51 L
FVC-Pre: 3.48 L
Post FEV1/FVC ratio: 79 %
Post FEV6/FVC ratio: 100 %
Pre FEV1/FVC ratio: 75 %
Pre FEV6/FVC Ratio: 100 %

## 2021-01-21 MED ORDER — BREO ELLIPTA 200-25 MCG/INH IN AEPB: 1.0000 | INHALATION_SPRAY | Freq: Every day | RESPIRATORY_TRACT | 6 refills | Status: DC

## 2021-01-21 NOTE — Patient Instructions (Signed)
Nice to see you again  Use Breo 1 puff daily, rinse your mouth out after use.  Lets see if this helps the breathing.  If it has not helped after 4 weeks or about a month, it is okay to stop this.  Just let me know.  I will send a message to your cardiologist.  Please let me know if I can help in any way.  If the medicine is too expensive let me know and we will try an alternative.  Return to clinic in 3 months or sooner as needed

## 2021-01-21 NOTE — Progress Notes (Signed)
@Patient  ID: , female    DOB: December 02, 1965, 55 y.o.   MRN: [id number]  Chief Complaint  Patient presents with  . Follow-up    Dizzy spells, Sob, thinks it may be anxiety.     Referring provider: [id number], PA-C  HPI:   55 year old whom we are seeing in follow up for evaluation of dyspnea on exertion.   Reviewed results of CT coronary as well as echocardiogram.  DOE unchanged.  Able to do her usual exercise classes but anything more strenuous like going up hills on the bike or inclines on treadmill feels that there are certain dyspnea exertion.  PFTs performed today, reviewed in detail, these are all normal.  HPI at initial visit:  She no dyspnea exertion for many weeks to months now.  Worse with inclines or stairs.  Usually okay on flat surfaces, day-to-day work.  First noticed it when going up stairs at work that taking the 2 flights of stairs really wore her out above and beyond what is normal.  Took a long time to recover in terms of her breathing standpoint.  She does have seasonal allergies.  She thinks that this shortness of breath may be a bit worse when her allergy symptoms are present.  Otherwise no environmental factors that she can identify, not better or worse at home compared to work etc.  The recent changes in her home, remodels, move etc.  No timing during the day was better or worse.  No other alleviating or exacerbating factors.  She has not used any inhalers.  Work-up began with cardiology evaluation.  Echocardiogram revealed grade 2 diastolic dysfunction, estimated elevated RA pressure of 15.  Cardiac monitor revealed rare SVT.  CT of coronary showed no calcium in coronary arteries.  Lung images personally viewed and interpreted as clear lungs, no infiltrate, effusion, initial changes etc.  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  MMRC: mMRC Dyspnea Scale mMRC Score  11/10/2020 1    Epworth:  No flowsheet data found.  Tests:   FENO:  No results found for: NITRICOXIDE  PFT: PFT Results Latest Ref Rng & Units 01/21/2021  FVC-Pre L 3.48  FVC-Predicted Pre % 100  FVC-Post L 3.51  FVC-Predicted Post % 100  Pre FEV1/FVC % % 75  Post FEV1/FCV % % 79  FEV1-Pre L 2.62  FEV1-Predicted Pre % 95  FEV1-Post L 2.76  DLCO uncorrected ml/min/mmHg 19.37  DLCO UNC% % 93  DLCO corrected ml/min/mmHg 19.37  DLCO COR %Predicted % 93  DLVA Predicted % 90  Personally reviewed interpreted as normal spirometry without significant bronchodilator response, normal lung volumes, DLCO within normal limits  WALK:  No flowsheet data found.  Imaging: Personally reviewed and as per EMR discussion this note  Lab Results:  CBC    Component Value Date/Time   WBC 14.5 (H) 02/26/2014 1711   RBC 4.40 02/26/2014 1711   HGB 12.3 02/26/2014 1711   HCT 36.3 02/26/2014 1711   PLT 356 02/26/2014 1711   MCV 82.5 02/26/2014 1711   MCH 28.0 02/26/2014 1711   MCHC 33.9 02/26/2014 1711   RDW 13.7 02/26/2014 1711   LYMPHSABS 0.4 (L) 02/08/2014 1030   MONOABS 0.3 02/08/2014 1030   EOSABS 0.2 02/08/2014 1030  BASOSABS 0.0 02/08/2014 1030    BMET    Component Value Date/Time   NA 138 02/26/2014 1711   K 4.3 02/26/2014 1711   CL 101 02/26/2014 1711   CO2 28 02/26/2014 1711   GLUCOSE 73 02/26/2014 1711   BUN 19 02/26/2014 1711   CREATININE 0.83 02/26/2014 1711   CALCIUM 9.8 02/26/2014 1711   GFRNONAA 85 (L) 02/08/2014 1030   GFRAA >90 02/08/2014 1030    BNP No results found for: BNP  ProBNP No results found for: PROBNP  Specialty Problems      Pulmonary Problems   Acute upper respiratory infection      Allergies  Allergen Reactions  .  Oxycodone-Acetaminophen Nausea And Vomiting    Extreme nausea & vomiting  . Vicodin [Hydrocodone-Acetaminophen] Nausea And Vomiting    Extreme nausea & vomiting    Immunization History  Administered Date(s) Administered  . Moderna Sars-Covid-2 Vaccination 06/23/2020, 07/21/2020    Past Medical History:  Diagnosis Date  . Anemia   . Anxiety   . Chronic UTI   . Headache   . Heart murmur   . Hypertension    only with prior OCP use  . Insomnia   . MVP (mitral valve prolapse)   . Post-operative state [facility] w/ bilat salpingectomy 05/22/2012  . Premature ovarian failure    resolved 03/2011 with normal FSH/estradiol    Tobacco History: Social History   Tobacco Use  Smoking Status Former Smoker  . Packs/day: 0.50  . Years: 6.00  . Pack years: 3.00  . Quit date: 61  . Years since quitting: 28.3  Smokeless Tobacco Never Used   Counseling given: Not Answered   Continue to not smoke  Outpatient Encounter Medications as of 01/21/2021  Medication Sig  . CALCIUM-VITAMIN D PO Take 2 tablets by mouth daily.  . clonazePAM (KLONOPIN) 0.5 MG tablet Take 0.5 mg by mouth at bedtime as needed for anxiety.  Marland Kitchen estradiol (VIVELLE-DOT) 0.1 MG/24HR patch Place 1 patch onto the skin 2 (two) times a week.  . fluticasone furoate-vilanterol (BREO ELLIPTA) 200-25 MCG/INH AEPB Inhale 1 puff into the lungs daily.  Marland Kitchen linaclotide (LINZESS) 145 MCG CAPS capsule Take 145 mcg by mouth as needed.  . metoprolol succinate (TOPROL-XL) 25 MG 24 hr tablet TAKE 1 TABLET BY MOUTH EVERY DAY  . valACYclovir (VALTREX) 1000 MG tablet Take 1,000 mg by mouth as needed.  . zolpidem (AMBIEN) 5 MG tablet Take by mouth.  . metoprolol tartrate (LOPRESSOR) 100 MG tablet Take 1 tablet (100 mg total) by mouth once for 1 dose. Take 1 tablet 2 hours before your CT scan   No facility-administered encounter medications on file as of 01/21/2021.     Review of Systems  Review of Systems  Chest pain with exertion.  No  orthopnea or PND.  Does note some lower extremity swelling over the last few weeks to months.  Comprehensive review of systems otherwise negative. Physical Exam  BP 118/68 (BP Location: Left Arm, Cuff Size: Normal)   Pulse 60   Temp 97.9 F (36.6 C)   Ht 5\' 4"  (1.626 m)   Wt 116 lb (52.6 kg)   LMP 05/09/2012   SpO2 100%   BMI 19.91 kg/m   Wt Readings from Last 5 Encounters:  01/21/21 116 lb (52.6 kg)  11/10/20 112 lb 4.8 oz (50.9 kg)  07/26/20 113 lb 9.6 oz (51.5 kg)  09/06/14 109 lb 2 oz (49.5 kg)  03/12/14 110 lb 12 oz (50.2  kg)    BMI Readings from Last 5 Encounters:  01/21/21 19.91 kg/m  11/10/20 18.69 kg/m  07/26/20 18.90 kg/m  09/06/14 18.73 kg/m  03/12/14 19.01 kg/m     Physical Exam General: Well-appearing, in no acute distress Eyes: EOMI, no icterus Cardiovascular: Regular rhythm, no murmurs Pulmonary: Clear to auscultation bilaterally, no wheezes or crackles Abdomen: Nondistended, bowel sounds present MSK: No synovitis, no joint effusion Neuro: Normal gait, no weakness Psych: Normal mood, full affect   Assessment & Plan:   DOE: Recent chest images 10/08/2020 from CT chest from coronary CT demonstrates clear lungs.  PFTs are normal. Low suspicion for asthma but will try therapeutic trial of ICS/LABA (breo) to see if has improvement. Likely component of deconditioning. Concern for diastolic dysfunction and worsening diastology with exercise as only objective finding on work up so far. Discussed role of CPET, would consider this as next step in work up.  Diastolic dysfunction: Suspect driver of dyspnea exertion.  No overt or frank heart failure symptoms or signs on exam at this time.  May benefit from diuretic but defer to cardiology as prior RA pressure elevated.   Return in about 3 months (around 04/22/2021).   Karren Burly, MD 01/21/2021

## 2021-01-21 NOTE — Progress Notes (Signed)
PFT done today. 

## 2021-02-01 DIAGNOSIS — R0609 Other forms of dyspnea: Secondary | ICD-10-CM

## 2021-02-01 DIAGNOSIS — R0600 Dyspnea, unspecified: Secondary | ICD-10-CM

## 2021-02-02 ENCOUNTER — Telehealth: Payer: Self-pay

## 2021-02-02 DIAGNOSIS — Z006 Encounter for examination for normal comparison and control in clinical research program: Secondary | ICD-10-CM

## 2021-02-02 NOTE — Telephone Encounter (Signed)
Called patient for 90 day Identify phone call no answer, I left a voicemail stating the intent of the phone call and our call back number to be reached in our department. 

## 2021-02-02 NOTE — Telephone Encounter (Signed)
Please advise on patient mychart message  Hey Dr. Judeth Horn, Sorry to bother you but did you send a message to Dr. Anne Fu about what he thought about a stress test? Did he respond?   You mentioned pulmonary hypertension. Could that be the reason for my issues?    Thank you for your help. I really appreciate it!

## 2021-02-07 NOTE — Telephone Encounter (Signed)
MH please let us know when the order for the stress test is placed.  Thanks  Hey Dr. Judeth Horn, Yes please make an appointment for me. I would like to see if I can get to the root of my problem. Also I think the inhaler may be helping but would like to see if you would call in something cheaper. The Breo is $35/month. Thank you so much!

## 2021-02-10 NOTE — Telephone Encounter (Signed)
Pt is calling back to see what the status is. Her # is [phone]

## 2021-02-15 ENCOUNTER — Encounter: Payer: Self-pay | Admitting: Plastic Surgery

## 2021-02-15 ENCOUNTER — Other Ambulatory Visit: Payer: Self-pay

## 2021-02-15 ENCOUNTER — Ambulatory Visit (INDEPENDENT_AMBULATORY_CARE_PROVIDER_SITE_OTHER): Payer: Self-pay | Admitting: Plastic Surgery

## 2021-02-15 DIAGNOSIS — Z719 Counseling, unspecified: Secondary | ICD-10-CM

## 2021-02-15 NOTE — Progress Notes (Signed)

## 2021-02-22 ENCOUNTER — Telehealth: Payer: Self-pay

## 2021-02-22 DIAGNOSIS — Z006 Encounter for examination for normal comparison and control in clinical research program: Secondary | ICD-10-CM

## 2021-02-22 NOTE — Telephone Encounter (Signed)
I called patient for her 90-day Identify Study follow up phone call. Patient is doing well. Pt stated she is still having chest pains at rest. She also stated she is having SOB with exertion. Pt has a cardiopulmonary exercise test on 04/05/2021. I reminded patient I would call her in January for her 1 year follow-up.

## 2021-03-01 ENCOUNTER — Ambulatory Visit: Payer: BC Managed Care – PPO | Admitting: Cardiology

## 2021-04-05 ENCOUNTER — Ambulatory Visit (HOSPITAL_COMMUNITY): Payer: BC Managed Care – PPO | Attending: Physician Assistant

## 2021-04-05 ENCOUNTER — Other Ambulatory Visit: Payer: Self-pay

## 2021-04-05 ENCOUNTER — Encounter (HOSPITAL_COMMUNITY): Payer: Self-pay | Admitting: Cardiology

## 2021-04-05 DIAGNOSIS — R0609 Other forms of dyspnea: Secondary | ICD-10-CM

## 2021-04-05 DIAGNOSIS — R0600 Dyspnea, unspecified: Secondary | ICD-10-CM

## 2021-04-18 ENCOUNTER — Telehealth: Payer: Self-pay | Admitting: Pulmonary Disease

## 2021-04-19 NOTE — Progress Notes (Signed)
Cardiopulmonary exercise test showed no limitation from lung. Lungs are normal which is good.

## 2021-04-19 NOTE — Telephone Encounter (Signed)
Karren Burly, MD  04/19/2021  8:49 AM EDT Back to Top     Cardiopulmonary exercise test showed no limitation from lung. Lungs are normal which is good.    Called and spoke with pt letting her know the results of the CPST and she verbalized understanding. Since pt's lungs look good, pt wants to know if anything showed up abnormal with her heart. Dr. Judeth Horn, please advise.

## 2021-04-19 NOTE — Telephone Encounter (Signed)
The results of the test indicated no airway or lung issue. The vas majority of the cardiac portion of the test is normal. It noted that at times the heart rate was a little low and a little fast. It was noted that the irregularity in heart rate could explain some symptoms but not totally clear. I would recommend re-engaging with her heart doctor for further evaluation.

## 2021-04-19 NOTE — Telephone Encounter (Signed)
Results dictated by Dr. Judeth Horn were given to pt via My Chart message. Nothing further needed at this time.

## 2021-04-19 NOTE — Telephone Encounter (Signed)
Dr. Judeth Horn please advise on the follow My Chart message:   Hi Dr. Judeth Horn, Do the results of my stress test mean everything is normal as far as my heart goes? Could you just summarize results for me please? Wondering why I feel like I give out so easily. Thank you for your time!  Thank you

## 2021-05-01 DIAGNOSIS — L255 Unspecified contact dermatitis due to plants, except food: Secondary | ICD-10-CM | POA: Diagnosis not present

## 2021-05-02 DIAGNOSIS — T07XXXA Unspecified multiple injuries, initial encounter: Secondary | ICD-10-CM | POA: Diagnosis not present

## 2021-05-02 DIAGNOSIS — L299 Pruritus, unspecified: Secondary | ICD-10-CM | POA: Diagnosis not present

## 2021-05-03 NOTE — Telephone Encounter (Signed)
See mychart message from 7/26. Will close encounter.

## 2021-05-23 DIAGNOSIS — Z13228 Encounter for screening for other metabolic disorders: Secondary | ICD-10-CM | POA: Diagnosis not present

## 2021-05-23 DIAGNOSIS — Z1329 Encounter for screening for other suspected endocrine disorder: Secondary | ICD-10-CM | POA: Diagnosis not present

## 2021-05-23 DIAGNOSIS — Z131 Encounter for screening for diabetes mellitus: Secondary | ICD-10-CM | POA: Diagnosis not present

## 2021-05-23 DIAGNOSIS — Z681 Body mass index (BMI) 19 or less, adult: Secondary | ICD-10-CM | POA: Diagnosis not present

## 2021-05-23 DIAGNOSIS — Z01419 Encounter for gynecological examination (general) (routine) without abnormal findings: Secondary | ICD-10-CM | POA: Diagnosis not present

## 2021-05-23 DIAGNOSIS — Z1321 Encounter for screening for nutritional disorder: Secondary | ICD-10-CM | POA: Diagnosis not present

## 2021-05-31 DIAGNOSIS — S60562A Insect bite (nonvenomous) of left hand, initial encounter: Secondary | ICD-10-CM | POA: Diagnosis not present

## 2021-05-31 DIAGNOSIS — S60561A Insect bite (nonvenomous) of right hand, initial encounter: Secondary | ICD-10-CM | POA: Diagnosis not present

## 2021-05-31 DIAGNOSIS — L282 Other prurigo: Secondary | ICD-10-CM | POA: Diagnosis not present

## 2021-06-23 ENCOUNTER — Telehealth: Payer: Self-pay | Admitting: Cardiology

## 2021-06-23 NOTE — Telephone Encounter (Signed)
  Pt is requesting to switch care from Dr. Anne Fu to Dr. Duke Salvia. She said she would like to see female doctor

## 2021-06-29 ENCOUNTER — Telehealth: Payer: Self-pay | Admitting: Cardiovascular Disease

## 2021-06-29 NOTE — Telephone Encounter (Signed)
   Pt made an appt with Dr. Duke Salvia, she ask if Dr. Duke Salvia can check all of her test results before her appt for her recommendations

## 2021-06-29 NOTE — Telephone Encounter (Signed)
Spoke to patient she stated she is changing care to Dr.Mabton and has appointment with her in December.Stated she continues to feel exhausted,occasional chest tightness.She would like to be seen sooner.Appointment scheduled with Dr.Orwin 10/18 at 2:20 pm.

## 2021-07-04 DIAGNOSIS — N390 Urinary tract infection, site not specified: Secondary | ICD-10-CM | POA: Diagnosis not present

## 2021-07-12 ENCOUNTER — Ambulatory Visit (HOSPITAL_BASED_OUTPATIENT_CLINIC_OR_DEPARTMENT_OTHER): Payer: BC Managed Care – PPO | Admitting: Cardiovascular Disease

## 2021-07-12 ENCOUNTER — Encounter (HOSPITAL_BASED_OUTPATIENT_CLINIC_OR_DEPARTMENT_OTHER): Payer: Self-pay | Admitting: Cardiovascular Disease

## 2021-07-12 ENCOUNTER — Encounter (HOSPITAL_BASED_OUTPATIENT_CLINIC_OR_DEPARTMENT_OTHER): Payer: Self-pay | Admitting: *Deleted

## 2021-07-12 ENCOUNTER — Other Ambulatory Visit: Payer: Self-pay

## 2021-07-12 VITALS — BP 130/70 | HR 54 | Ht 64.0 in | Wt 116.4 lb

## 2021-07-12 DIAGNOSIS — I4729 Other ventricular tachycardia: Secondary | ICD-10-CM

## 2021-07-12 DIAGNOSIS — I5032 Chronic diastolic (congestive) heart failure: Secondary | ICD-10-CM | POA: Diagnosis not present

## 2021-07-12 DIAGNOSIS — R0602 Shortness of breath: Secondary | ICD-10-CM

## 2021-07-12 DIAGNOSIS — Z01812 Encounter for preprocedural laboratory examination: Secondary | ICD-10-CM

## 2021-07-12 HISTORY — DX: Other ventricular tachycardia: I47.29

## 2021-07-12 MED ORDER — FUROSEMIDE 20 MG PO TABS: ORAL_TABLET | ORAL | 1 refills | Status: DC

## 2021-07-12 MED ORDER — LORAZEPAM 0.5 MG PO TABS
ORAL_TABLET | ORAL | 0 refills | Status: DC
Start: 2021-07-12 — End: 2022-04-07

## 2021-07-12 NOTE — Progress Notes (Signed)
Cardiology Office Note:    Date:  07/12/2021   ID:  Michelle Kramer, DOB 1966-06-23, MRN [id number]  PCP:  Leane Call, PA-C   [facility] Providers Cardiologist:  None     Referring MD: Leane Call, PA-C   No chief complaint on file.  History of Present Illness:    Michelle Kramer is a 55 y.o. female with a hx of anemia, anxiety, murmurs, hypertension, insomnia, and mitral valve prolapse coming in today for evaluation. She previously saw Dr. Anne Fu 07/2020 with palpitation and shortness of breath. She reported that her sons have ascending aortic aneuryms followed by regular echos. She has hx of anxiety and mitral valve prolapse. She was referred for a 14 day Zio monitor which had multiple paroxysmal svt and atrial tachycardia. She also had 9 beats of NSVT. She had an echo 10/01/2020 showing LVF 50-60% and grade 2 diastolic dysfunction. Her R atrial pressure was . She had a Coronary CTA 09/2020 with no evidence of coronary artery disease. She had a CPX 03/2021 with a blunted heart rate response during exercise and 2 minutes of SVT during recovery.  Today, she appears to be doing alright. Lately, she has has been feeling extra fatigue, shortness of breath, dizziness, racing heartbeats, and chest tightness. These symptoms began at least a year ago. She noticed these symptoms more at work because she has to go up 3 flights of stairs and at the end of the first flight she felt shortness of breath. The symptoms are worsened during exertion such as going up stairs or hiking. She takes metoprolol every evening. However, before bed, she reports occasionally feeling her heart pounding while laying on her L side and she believes this was related to taking the medicine at night. She also experiences "wooziness" she believes is from taking metoprolol. For exercise, she tries to walk every morning and she goes to the gym 2-3 times a week to do weight lifting and cardio exercises. He  limits her salt intake in her diet and does not use table salt. She has been trying to drink more water because she is reportedly susceptible to UTIs. She denies any headaches, syncope, orthopnea, PND, or lower extremity edema.   Past Medical History:  Diagnosis Date   Anemia    Anxiety    Chronic UTI    Headache    Heart murmur    Hypertension    only with prior OCP use   Insomnia    MVP (mitral valve prolapse)    NSVT (nonsustained ventricular tachycardia) 07/12/2021   Post-operative state - [facility] w/ bilat salpingectomy 05/22/2012   Premature ovarian failure    resolved 03/2011 with normal FSH/estradiol    Past Surgical History:  Procedure Laterality Date   ABDOMINAL HYSTERECTOMY     BUNIONECTOMY     right foot   HYSTEROSCOPY  2002   LAPAROSCOPIC SUPRACERVICAL HYSTERECTOMY  05/21/2012   Procedure: LAPAROSCOPIC SUPRACERVICAL HYSTERECTOMY;  Surgeon: Tresa Endo A. Ernestina Penna, MD;  Location: [facility];  Service: Gynecology;  Laterality: N/A;   ~Laprascopic Supracervical Hysterectomy Attempted, Converted into a Robotic Assisted Hysterectomy  Laprascopic Supracervical Hysterectomy with Morcellation Possible Robotic Assistance Book in Robot Room Do Not Drape Robot     Current Medications: Current Meds  Medication Sig   CALCIUM-VITAMIN D PO Take 2 tablets by mouth daily.   clonazePAM (KLONOPIN) 0.5 MG tablet Take 0.5 mg by mouth at bedtime as needed for anxiety.   estradiol (VIVELLE-DOT) 0.1 MG/24HR patch Place 1  patch onto the skin 2 (two) times a week.   fluticasone furoate-vilanterol (BREO ELLIPTA) 200-25 MCG/INH AEPB Inhale 1 puff into the lungs daily.   furosemide (LASIX) 20 MG tablet Take 1 tablet by mouth for 2 days and then as needed for swelling or shortness of breath   linaclotide (LINZESS) 145 MCG CAPS capsule Take 145 mcg by mouth as needed.   metoprolol succinate (TOPROL-XL) 25 MG 24 hr tablet TAKE 1 TABLET BY MOUTH EVERY DAY   valACYclovir (VALTREX) 1000 MG tablet Take 1,000  mg by mouth as needed.   zolpidem (AMBIEN) 5 MG tablet Take by mouth.     Allergies:   Oxycodone-acetaminophen and Vicodin [hydrocodone-acetaminophen]   Social History   Socioeconomic History   Marital status: Single    Spouse name: Not on file   Number of children: Not on file   Years of education: Not on file   Highest education level: Not on file  Occupational History   Not on file  Tobacco Use   Smoking status: Former    Packs/day: 0.50    Years: 6.00    Pack years: 3.00    Types: Cigarettes    Quit date: 89    Years since quitting: 28.8   Smokeless tobacco: Never  Substance and Sexual Activity   Alcohol use: Yes    Comment: occasional   Drug use: No   Sexual activity: Yes    Birth control/protection: Surgical    Comment: vasectomy  Other Topics Concern   Not on file  Social History Narrative   Lives with fiance of 6 years, 3 children at home, 2 biological, 1 step, [employer], exercises some   Social Determinants of Health   Financial Resource Strain: Low Risk    Difficulty of Paying Living Expenses: Not hard at all  Food Insecurity: No Food Insecurity   Worried About Programme researcher, broadcasting/film/video in the Last Year: Never true   Barista in the Last Year: Never true  Transportation Needs: No Transportation Needs   Lack of Transportation (Medical): No   Lack of Transportation (Non-Medical): No  Physical Activity: Sufficiently Active   Days of Exercise per Week: 5 days   Minutes of Exercise per Session: 50 min  Stress: Not on file  Social Connections: Not on file     Family History: The patient's family history includes Heart disease in her maternal grandmother; Heart failure in her mother; Hypertension in her maternal grandmother and mother; Lung cancer in her mother; Lupus in her sister. She was adopted.  ROS:   Please see the history of present illness.    (+) Fatigue (+) Shortness of breath (+) Dizziness (+) Palpitations (+) Chest Tightness All other  systems reviewed and are negative.  EKGs/Labs/Other Studies Reviewed:    The following studies were reviewed today: Exercise Test 04/05/2021 Conclusion: Exercise testing with gas exchange demonstrates normal functional capacity when compared to matched sedentary norms. There is no indication for cardiopulmonary abnormality. However, there was blunted heart rate response to exercise and a brief run of SVT that initiated and resolved during active recovery that could be contributing to patients exertional symptoms.   CT Coronary 06/14/2021 IMPRESSION: 1. Coronary calcium score of 0. This was 0 percentile for age and sex matched control.   2. Normal coronary origin.   3. No evidence of CAD.   4. The timing of the contrast bolus is poor. Unable to fully visualize distal vessels. Given that there are no coronary  calcifications, it is unlikely that there are any significant stenoses. However, given that the mid to distal arteries are not well-opacified, cannot fully rule out coronary artery disease.   5.  Consider non-cardiac causes of chest pain.  Echo 10/01/2020 1. Left ventricular ejection fraction, by estimation, is 55 to 60%. The  left ventricle has normal function. The left ventricle has no regional  wall motion abnormalities. Left ventricular diastolic parameters are  consistent with Grade II diastolic  dysfunction (pseudonormalization).   2. Right ventricular systolic function is normal. The right ventricular  size is normal. There is normal pulmonary artery systolic pressure.   3. Mitral valve prolapse not present. The mitral valve is normal in  structure. Trivial mitral valve regurgitation. No evidence of mitral  stenosis.   4. The aortic valve is tricuspid. Aortic valve regurgitation is not  visualized. No aortic stenosis is present.   5. The inferior vena cava is dilated in size with <50% respiratory  variability, suggesting right atrial pressure of 15 mmHg.   EKG:    07/12/2021: Sinus bradycardia Rate 54 bpm  Recent Labs: No results found for requested labs within last 8760 hours.  Recent Lipid Panel No results found for: CHOL, TRIG, HDL, CHOLHDL, VLDL, LDLCALC, LDLDIRECT       Physical Exam:    VS:  BP 130/70 (BP Location: Right Arm, Patient Position: Sitting, Cuff Size: Normal)   Pulse (!) 54   Ht 5\' 4"  (1.626 m)   Wt 116 lb 6.4 oz (52.8 kg)   LMP 05/09/2012   BMI 19.98 kg/m  , BMI Body mass index is 19.98 kg/m. GENERAL:  Well appearing HEENT: Pupils equal round and reactive, fundi not visualized, oral mucosa unremarkable NECK:  No jugular venous distention, waveform within normal limits, carotid upstroke brisk and symmetric, no bruits, no thyromegaly LYMPHATICS:  No cervical adenopathy LUNGS:  Clear to auscultation bilaterally HEART:  RRR.  PMI not displaced or sustained,S1 and S2 within normal limits, no S3, no S4, no clicks, no rubs, no murmurs ABD:  Flat, positive bowel sounds normal in frequency in pitch, no bruits, no rebound, no guarding, no midline pulsatile mass, no hepatomegaly, no splenomegaly EXT:  2 plus pulses throughout, no edema, no cyanosis no clubbing SKIN:  No rashes no nodules NEURO:  Cranial nerves II through XII grossly intact, motor grossly intact throughout PSYCH:  Cognitively intact, oriented to person place and time  ASSESSMENT:    1. SOB (shortness of breath)   2. Chronic diastolic heart failure (HCC)   3. NSVT (nonsustained ventricular tachycardia)    PLAN:   Chronic diastolic heart failure (HCC) Ms. Tucker had grade 2 diastolic dysfunction on her echo.  Right atrial pressure was 15 mmHg.  She does not have any clear risk factors for this.  She has normal blood pressure and does not have pulmonary hypertension.  She has no history of prior pulmonary emboli and no autoimmune disease history.  Her myocardium is not thick and she just does not have any risk factors for grade 2 diastolic dysfunction.  We will get  a cardiac MRI to ensure that there is no evidence of any infiltrative cardiomyopathy that could be contributing.  She is also had runs of NSVT and multiple atrial arrhythmias.  Cardiac MRI will also help assess for any scarring.  We will try giving her 20 mg of Lasix to take for couple days to see if that helps with her symptoms.  Check a BNP today.  Reduce  metoprolol to 12.5 mg due to bradycardia and fatigue.  NSVT (nonsustained ventricular tachycardia) She had NSVT on her monitor as well as atrial tachycardia and supraventricular tachycardia.  She is not tolerating metoprolol due to bradycardia and fatigue.  We did discuss referral to EP to consider antiarrhythmic options.  However she is not very bothered by the arrhythmias and is not interested at this time.  We did discuss the fact that her arrhythmias may be contributing to her exertional intolerance but she would like to wait for now.   Medication Adjustments/Labs and Tests Ordered: Current medicines are reviewed at length with the patient today.  Concerns regarding medicines are outlined above.  Orders Placed This Encounter  Procedures   MR CARDIAC MORPHOLOGY W WO CONTRAST   B Nat Peptide   EKG 12-Lead    Meds ordered this encounter  Medications   furosemide (LASIX) 20 MG tablet    Sig: Take 1 tablet by mouth for 2 days and then as needed for swelling or shortness of breath    Dispense:  15 tablet    Refill:  1     Patient Instructions  Medication Instructions:  TAKE FUROSEMIDE 20 MG DAILY FOR 2 DAYS ONLY THEN JUST TAKE AS NEEDED FOR SWELLING OR SHORTNESS OF BREATH   DECREASE YOUR METOPROLOL TO   *If you need a refill on your cardiac medications before your next appointment, please call your pharmacy*  Lab Work: BNP TODAY   If you have labs (blood work) drawn today and your tests are completely normal, you will receive your results only by: MyChart Message (if you have MyChart) OR A paper copy in the mail If you have  any lab test that is abnormal or we need to change your treatment, we will call you to review the results.  Testing/Procedures: Your physician has requested that you have a cardiac MRI. Cardiac MRI uses a computer to create images of your heart as its beating, producing both still and moving pictures of your heart and major blood vessels. For further information please visit [email or web address]. Please follow the instruction sheet given to you today for more information.  Follow-Up: At [facility], you and your health needs are our priority.  As part of our continuing mission to provide you with exceptional heart care, we have created designated Provider Care Teams.  These Care Teams include your primary Cardiologist (physician) and Advanced Practice Providers (APPs -  Physician Assistants and Nurse Practitioners) who all work together to provide you with the care you need, when you need it.  We recommend signing up for the patient portal called "MyChart".  Sign up information is provided on this After Visit Summary.  MyChart is used to connect with patients for Virtual Visits (Telemedicine).  Patients are able to view lab/test results, encounter notes, upcoming appointments, etc.  Non-urgent messages can be sent to your provider as well.   To learn more about what you can do with MyChart, go to [email or web address].    Your next appointment:   2 month(s)  The format for your next appointment:   In Person  Provider:   Chilton Si, MD     Disposition: FU with [facility] C. Duke Salvia, MD, [facility] in 2-3 months   I,Mykaella Javier,acting as a scribe for Chilton Si, MD.,have documented all relevant documentation on the behalf of Chilton Si, MD,as directed by  Chilton Si, MD while in the presence of Chilton Si, MD.   Time spent: 50 minutes-Greater than 50% of  this time was spent in counseling, explanation of diagnosis, planning of further management, and coordination  of care.   Cipriano Mile  07/12/2021 3:24 PM    [facility]

## 2021-07-12 NOTE — Patient Instructions (Addendum)
Medication Instructions:  TAKE FUROSEMIDE 20 MG DAILY FOR 2 DAYS ONLY THEN JUST TAKE AS NEEDED FOR SWELLING OR SHORTNESS OF BREATH   DECREASE YOUR METOPROLOL TO 25 MG 1/2 TABLET DAILY   TAKE LORAZEPAM 0.5 MG 1 TABLET 30 MINUTES PRIOR TO MRI AND OK TO TAKE ANOTHER IF YOU NEED TO WHILE ON THE TABLE THIS MAY MAKE YOU DROWSY SO YOU SHOULD HAVE SOMEONE DRIVE YOU   *If you need a refill on your cardiac medications before your next appointment, please call your pharmacy*  Lab Work: BNP TODAY   BMET 1 WEEK PRIOR TO CARDIAC MRI   If you have labs (blood work) drawn today and your tests are completely normal, you will receive your results only by: MyChart Message (if you have MyChart) OR A paper copy in the mail If you have any lab test that is abnormal or we need to change your treatment, we will call you to review the results.  Testing/Procedures: Your physician has requested that you have a cardiac MRI. Cardiac MRI uses a computer to create images of your heart as its beating, producing both still and moving pictures of your heart and major blood vessels. For further information please visit [email or web address]. Please follow the instruction sheet given to you today for more information.  Follow-Up: At [facility], you and your health needs are our priority.  As part of our continuing mission to provide you with exceptional heart care, we have created designated Provider Care Teams.  These Care Teams include your primary Cardiologist (physician) and Advanced Practice Providers (APPs -  Physician Assistants and Nurse Practitioners) who all work together to provide you with the care you need, when you need it.  We recommend signing up for the patient portal called "MyChart".  Sign up information is provided on this After Visit Summary.  MyChart is used to connect with patients for Virtual Visits (Telemedicine).  Patients are able to view lab/test results, encounter notes, upcoming appointments,  etc.  Non-urgent messages can be sent to your provider as well.   To learn more about what you can do with MyChart, go to [email or web address].    Your next appointment:   2 month(s)  The format for your next appointment:   In Person  Provider:   Chilton Si, MD  [facility] [address] [place], [place] [address] [phone]  Please take advantage of the free valet parking available at the MAIN entrance (A entrance). Proceed to the [facility] Radiology Department (First Floor). ? Magnetic resonance imaging (MRI) is a painless test that produces images of the inside of the body without using Xrays.  During an MRI, strong magnets and radio waves work together in a Data processing manager to form detailed images.   MRI images may provide more details about a medical condition than X-rays, CT scans, and ultrasounds can provide.  You may be given earphones to listen for instructions.  You may eat a light breakfast and take medications as ordered with the exception of HCTZ (fluid pill, other). Please avoid stimulants for 12 hr prior to test. (Ie. Caffeine, nicotine, chocolate, or antihistamine medications)  If a contrast material will be used, an IV will be inserted into one of your veins. Contrast material will be injected into your IV. It will leave your body through your urine within a day. You may be told to drink plenty of fluids to help flush the contrast material out of your system.  You will be asked  to remove all metal, including: Watch, jewelry, and other metal objects including hearing aids, hair pieces and dentures. Also wearable glucose monitoring systems (ie. Freestyle Libre and Omnipods) (Braces and fillings normally are not a problem.)   TEST WILL TAKE APPROXIMATELY 1 HOUR  PLEASE NOTIFY SCHEDULING AT LEAST 24 HOURS IN ADVANCE IF YOU ARE UNABLE TO KEEP YOUR APPOINTMENT. [phone]  Please call Rockwell Alexandria, cardiac imaging nurse navigator with any  questions/concerns. Rockwell Alexandria RN Navigator Cardiac Imaging Larey Brick RN Navigator Cardiac Imaging [facility] [phone] Office

## 2021-07-12 NOTE — Assessment & Plan Note (Signed)
She had NSVT on her monitor as well as atrial tachycardia and supraventricular tachycardia.  She is not tolerating metoprolol due to bradycardia and fatigue.  We did discuss referral to EP to consider antiarrhythmic options.  However she is not very bothered by the arrhythmias and is not interested at this time.  We did discuss the fact that her arrhythmias may be contributing to her exertional intolerance but she would like to wait for now.

## 2021-07-12 NOTE — Assessment & Plan Note (Addendum)
Michelle Kramer had grade 2 diastolic dysfunction on her echo.  Right atrial pressure was 15 mmHg.  She does not have any clear risk factors for this.  She has normal blood pressure and does not have pulmonary hypertension.  She has no history of prior pulmonary emboli and no autoimmune disease history.  Her myocardium is not thick and she just does not have any risk factors for grade 2 diastolic dysfunction.  We will get a cardiac MRI to ensure that there is no evidence of any infiltrative cardiomyopathy that could be contributing.  She is also had runs of NSVT and multiple atrial arrhythmias.  Cardiac MRI will also help assess for any scarring.  We will try giving her 20 mg of Lasix to take for couple days to see if that helps with her symptoms.  Check a BNP today.  Reduce metoprolol to 12.5 mg due to bradycardia and fatigue.

## 2021-07-12 NOTE — Telephone Encounter (Signed)
This encounter was created in error - please disregard.

## 2021-07-13 ENCOUNTER — Other Ambulatory Visit (HOSPITAL_BASED_OUTPATIENT_CLINIC_OR_DEPARTMENT_OTHER): Payer: Self-pay | Admitting: *Deleted

## 2021-07-13 DIAGNOSIS — R0602 Shortness of breath: Secondary | ICD-10-CM

## 2021-07-13 DIAGNOSIS — Z01812 Encounter for preprocedural laboratory examination: Secondary | ICD-10-CM

## 2021-07-13 LAB — BRAIN NATRIURETIC PEPTIDE: BNP: 69.3 pg/mL (ref 0.0–100.0)

## 2021-07-22 ENCOUNTER — Ambulatory Visit (INDEPENDENT_AMBULATORY_CARE_PROVIDER_SITE_OTHER): Payer: Self-pay | Admitting: Plastic Surgery

## 2021-07-22 ENCOUNTER — Encounter: Payer: BC Managed Care – PPO | Admitting: Plastic Surgery

## 2021-07-22 ENCOUNTER — Encounter: Payer: Self-pay | Admitting: Plastic Surgery

## 2021-07-22 ENCOUNTER — Other Ambulatory Visit: Payer: Self-pay

## 2021-07-22 DIAGNOSIS — Z719 Counseling, unspecified: Secondary | ICD-10-CM

## 2021-07-22 NOTE — Progress Notes (Signed)
Botulinum Toxin Procedure Note  Procedure: Cosmetic botulinum toxin Pre-operative Diagnosis: Dynamic rhytides  Post-operative Diagnosis: Same  Complications:  None  Brief history: The patient desires botulinum toxin injection of her forehead. I discussed with the patient this proposed procedure of botulinum toxin injections, which is customized depending on the particular needs of the patient. It is performed on facial rhytids as a temporary correction. The alternatives were discussed with the patient. The risks were addressed including bleeding, scarring, infection, damage to deeper structures, asymmetry, and chronic pain, which may occur infrequently after a procedure. The individual's choice to undergo a surgical procedure is based on the comparison of risks to potential benefits. Other risks include unsatisfactory results, brow ptosis, eyelid ptosis, allergic reaction, temporary paralysis, which should go away with time, bruising, blurring disturbances and delayed healing. Botulinum toxin injections do not arrest the aging process or produce permanent tightening of the eyelid.  Operative intervention maybe necessary to maintain the results of a blepharoplasty or botulinum toxin. The patient understands and wishes to proceed.  Procedure: The area was prepped with alcohol and dried with a clean gauze. Using a clean technique, the botulinum toxin was diluted with 1.25 cc of preservative-free normal saline which was slowly injected with an 18 gauge needle in a tuberculin syringes.  A 32 gauge needles were then used to inject the botulinum toxin. This mixture allow for an aliquot of 4 units per 0.1 cc in each injection site.    Subsequently the mixture was injected in the glabellar and forehead area with preservation of the temporal branch to the lateral eyebrow as well as into each lateral canthal area beginning from the lateral orbital rim medial to the zygomaticus major in 3 separate areas. A total of  30 Units of botulinum toxin was used. The forehead and glabellar area was injected with care to inject intramuscular only while holding pressure on the supratrochlear vessels in each area during each injection on either side of the medial corrugators. The injection proceeded vertically superiorly to the medial 2/3 of the frontalis muscle and superior 2/3 of the lateral frontalis, again with preservation of the frontal branch.  No complications were noted. Light pressure was held for 5 minutes. She was instructed explicitly in post-operative care.  Botox LOT:  [id number] EXP:  7/24 

## 2021-08-02 DIAGNOSIS — Z01812 Encounter for preprocedural laboratory examination: Secondary | ICD-10-CM | POA: Diagnosis not present

## 2021-08-02 DIAGNOSIS — R0602 Shortness of breath: Secondary | ICD-10-CM | POA: Diagnosis not present

## 2021-08-02 LAB — CBC WITH DIFFERENTIAL/PLATELET
Basophils Absolute: 0.1 10*3/uL (ref 0.0–0.2)
Basos: 1 %
EOS (ABSOLUTE): 0.3 10*3/uL (ref 0.0–0.4)
Eos: 4 %
Hematocrit: 39 % (ref 34.0–46.6)
Hemoglobin: 13.2 g/dL (ref 11.1–15.9)
Immature Grans (Abs): 0 10*3/uL (ref 0.0–0.1)
Immature Granulocytes: 0 %
Lymphocytes Absolute: 1.6 10*3/uL (ref 0.7–3.1)
Lymphs: 23 %
MCH: 30.1 pg (ref 26.6–33.0)
MCHC: 33.8 g/dL (ref 31.5–35.7)
MCV: 89 fL (ref 79–97)
Monocytes Absolute: 0.5 10*3/uL (ref 0.1–0.9)
Monocytes: 6 %
Neutrophils Absolute: 4.7 10*3/uL (ref 1.4–7.0)
Neutrophils: 66 %
Platelets: 271 10*3/uL (ref 150–450)
RBC: 4.39 x10E6/uL (ref 3.77–5.28)
RDW: 12.5 % (ref 11.7–15.4)
WBC: 7.2 10*3/uL (ref 3.4–10.8)

## 2021-08-02 LAB — BASIC METABOLIC PANEL
BUN/Creatinine Ratio: 13 (ref 9–23)
BUN: 11 mg/dL (ref 6–24)
CO2: 26 mmol/L (ref 20–29)
Calcium: 10.2 mg/dL (ref 8.7–10.2)
Chloride: 100 mmol/L (ref 96–106)
Creatinine, Ser: 0.86 mg/dL (ref 0.57–1.00)
Glucose: 82 mg/dL (ref 70–99)
Potassium: 4.8 mmol/L (ref 3.5–5.2)
Sodium: 138 mmol/L (ref 134–144)
eGFR: 80 mL/min/{1.73_m2} (ref >59)

## 2021-08-11 ENCOUNTER — Telehealth: Payer: Self-pay | Admitting: Cardiovascular Disease

## 2021-08-11 ENCOUNTER — Telehealth (HOSPITAL_COMMUNITY): Payer: Self-pay | Admitting: *Deleted

## 2021-08-11 NOTE — Telephone Encounter (Signed)
Reaching out to patient to offer assistance regarding upcoming cardiac imaging study; pt verbalizes understanding of appt date/time, parking situation and where to check in, and medications ordered, and verified current allergies; name and call back number provided for further questions should they arise  Larey Brick RN Navigator Cardiac Imaging [facility] [phone] office [phone] cell  Patient aware to take her ativan upon arrival to hospital and will be accompanied by her husband.

## 2021-08-11 NOTE — Telephone Encounter (Signed)
   Pt c/o medication issue:  1. Name of Medication: LORazepam (ATIVAN) 0.5 MG tablet  2. How are you currently taking this medication (dosage and times per day)? Not taking  3. Are you having a reaction (difficulty breathing--STAT)? no  4. What is your medication issue? Patient states she was given lorazepam for her MRI and would like to know if she can be given something stronger, because she's taken it before and it did not do much.

## 2021-08-12 ENCOUNTER — Ambulatory Visit (HOSPITAL_COMMUNITY)
Admission: RE | Admit: 2021-08-12 | Discharge: 2021-08-12 | Disposition: A | Discharge status: Home / Self Care | Payer: BC Managed Care – PPO | Source: Ambulatory Visit | Attending: Cardiovascular Disease | Admitting: Cardiovascular Disease

## 2021-08-12 ENCOUNTER — Other Ambulatory Visit: Payer: Self-pay

## 2021-08-12 DIAGNOSIS — R0602 Shortness of breath: Secondary | ICD-10-CM | POA: Insufficient documentation

## 2021-08-12 DIAGNOSIS — I5032 Chronic diastolic (congestive) heart failure: Secondary | ICD-10-CM | POA: Diagnosis not present

## 2021-08-12 IMAGING — MR MR CARD MORPHOLOGY WO/W CM
45 of 48 series · 45 of 48 positions shown · IV contrast (gadavist)
Comparison: none

CLINICAL DATA: NSVT, Diastolic dysfunction

EXAM:
CARDIAC MRI
TECHNIQUE: The patient was scanned on a 1.5 Tesla Siemens magnet. A dedicated
cardiac coil was used. Functional imaging was done using Fiesta
sequences. [DATE], and 4 chamber views were done to assess for RWMA's.
Modified RATLABALA rule using a short axis stack was used to
calculate an ejection fraction on a dedicated work station using
Circle software. The patient received 6 cc of Gadavist. After 10
minutes inversion recovery sequences were used to assess for
infiltration and scar tissue.
CONTRAST:  Gadavist

[Series 4: [id number]_bh · axial · 8.0mm · 1.41mm/px · 1 of 16 slices shown]
[im 1/16]
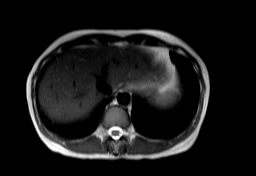

[Series 8: bSSFP · oblique · 8.0mm · 1.43mm/px · 1 of 25 slices shown (1 of 18)]
[im 1/25]
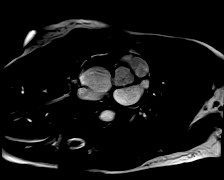

[Series 9: bSSFP · oblique · 8.0mm · 1.43mm/px · 1 of 25 slices shown (2 of 18)]
[im 1/25]
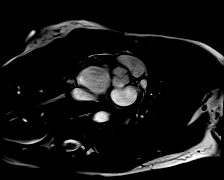

[Series 10: bSSFP · oblique · 8.0mm · 1.43mm/px · 1 of 25 slices shown (3 of 18)]
[im 1/25]
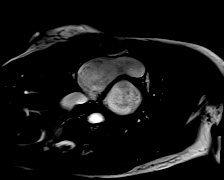

[Series 11: bSSFP · oblique · 8.0mm · 1.43mm/px · 1 of 25 slices shown (4 of 18)]
[im 1/25]
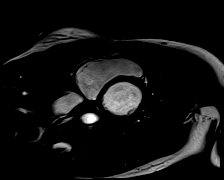

[Series 12: bSSFP · oblique · 8.0mm · 1.43mm/px · 1 of 25 slices shown (5 of 18)]
[im 1/25]
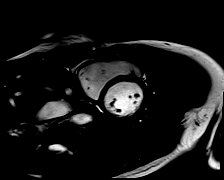

[Series 13: bSSFP · oblique · 8.0mm · 1.43mm/px · 1 of 25 slices shown (6 of 18)]
[im 1/25]
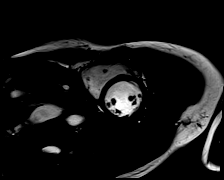

[Series 14: bSSFP · oblique · 8.0mm · 1.43mm/px · 1 of 25 slices shown (7 of 18)]
[im 1/25]
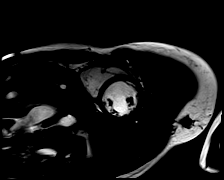

[Series 15: bSSFP · oblique · 8.0mm · 1.43mm/px · 1 of 25 slices shown (8 of 18)]
[im 1/25]
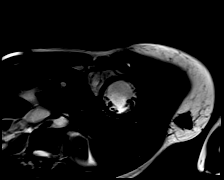

[Series 16: bSSFP · oblique · 8.0mm · 1.43mm/px · 1 of 25 slices shown (9 of 18)]
[im 1/25]
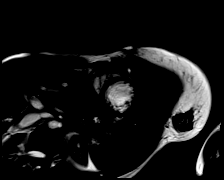

[Series 17: bSSFP · oblique · 8.0mm · 1.43mm/px · 1 of 25 slices shown (10 of 18)]
[im 1/25]
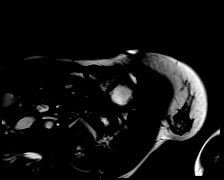

[Series 18: bSSFP · oblique · 8.0mm · 1.43mm/px · 1 of 25 slices shown (11 of 18)]
[im 1/25]
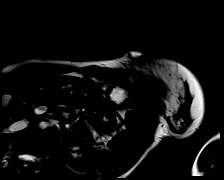

[Series 19: bSSFP · oblique · 8.0mm · 1.43mm/px · 1 of 25 slices shown (12 of 18)]
[im 1/25]
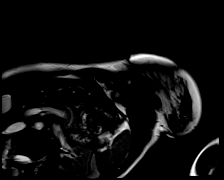

[Series 20: bSSFP · oblique · 8.0mm · 1.43mm/px · 1 of 25 slices shown (13 of 18)]
[im 1/25]
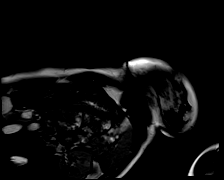

[Series 21: bSSFP · oblique · 8.0mm · 1.43mm/px · 1 of 25 slices shown (14 of 18)]
[im 1/25]
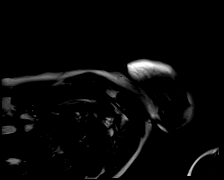

[Series 22: bSSFP · oblique · 6.0mm · 1.41mm/px · 1 of 25 slices shown (15 of 18)]
[im 1/25]
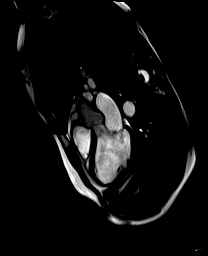

[Series 23: bSSFP · coronal · 6.0mm · 1.41mm/px · 1 of 25 slices shown (16 of 18)]
[im 1/25]
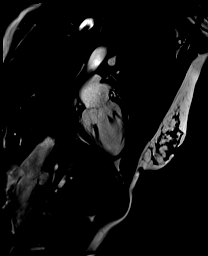

[Series 24: bSSFP · oblique · 6.0mm · 1.33mm/px · 1 of 25 slices shown (17 of 18)]
[im 1/25]
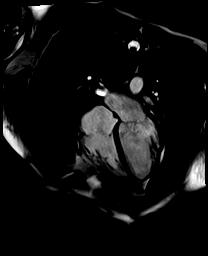

[Series 28: (id)_long_t1 · oblique · 8.0mm · 1.56mm/px · 1 of 24 slices shown]
[im 1/24]
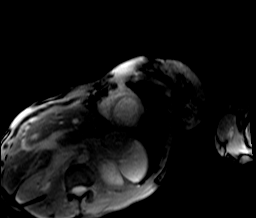

[Series 29: (id)_long_t1_moco · oblique · 8.0mm · 1.56mm/px · 1 of 24 slices shown]
[im 1/24]
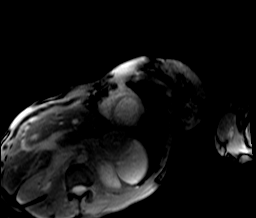

[Series 30: (id)_long_t1_moco_t1 · oblique · 8.0mm · 1.56mm/px · 1 of 6 slices shown]
[im 1/6]
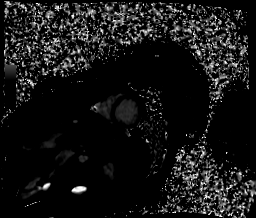

[Series 32: (id)_trufi · oblique · 8.0mm · 2.08mm/px · 1 of 9 slices shown]
[im 1/9]
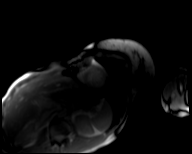

[Series 33: (id)_trufi_moco · oblique · 8.0mm · 2.08mm/px · 1 of 9 slices shown]
[im 1/9]
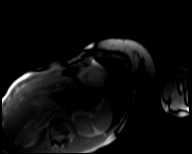

[Series 34: (id)_trufi_moco_t2 · oblique · 8.0mm · 2.08mm/px · 1 of 4 slices shown]
[im 1/4]
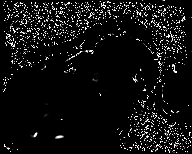

[Series 36: pre short axis · oblique · non-contrast · 8.0mm · 2.25mm/px · 1 of 10 slices shown (1 of 6)]
[im 1/10]
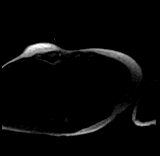

[Series 37: pre short axis · oblique · non-contrast · 8.0mm · 2.25mm/px · 1 of 10 slices shown (2 of 6)]
[im 1/10]
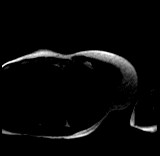

[Series 38: pre short axis · oblique · non-contrast · 8.0mm · 2.25mm/px · 1 of 10 slices shown (3 of 6)]
[im 1/10]
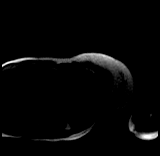

[Series 39: pre short axis · oblique · non-contrast · 8.0mm · 2.25mm/px · 1 of 10 slices shown (4 of 6)]
[im 1/10]
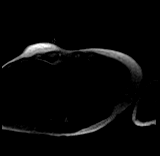

[Series 40: pre short axis · oblique · non-contrast · 8.0mm · 2.25mm/px · 1 of 10 slices shown (5 of 6)]
[im 1/10]
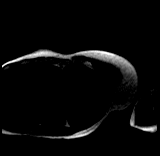

[Series 41: pre short axis · oblique · non-contrast · 8.0mm · 2.25mm/px · 1 of 10 slices shown (6 of 6)]
[im 1/10]
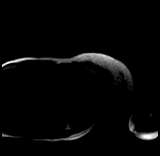

[Series 42: rest short axis · oblique · 8.0mm · 2.25mm/px · 1 of 60 slices shown (1 of 6)]
[im 1/60]
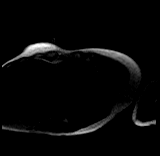

[Series 43: rest short axis · oblique · 8.0mm · 2.25mm/px · 1 of 60 slices shown (2 of 6)]
[im 1/60]
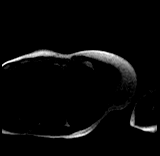

[Series 44: rest short axis · oblique · 8.0mm · 2.25mm/px · 1 of 60 slices shown (3 of 6)]
[im 1/60]
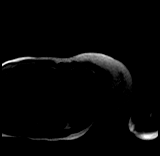

[Series 45: rest short axis · oblique · 8.0mm · 2.25mm/px · 1 of 60 slices shown (4 of 6)]
[im 1/60]
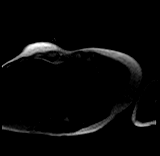

[Series 46: rest short axis · oblique · 8.0mm · 2.25mm/px · 1 of 60 slices shown (5 of 6)]
[im 1/60]
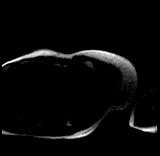

[Series 47: rest short axis · oblique · 8.0mm · 2.25mm/px · 1 of 60 slices shown (6 of 6)]
[im 1/60]
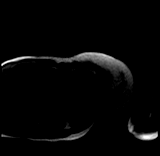

[Series 48: bSSFP · coronal · 6.0mm · 1.41mm/px · 1 of 25 slices shown (18 of 18)]
[im 1/25]
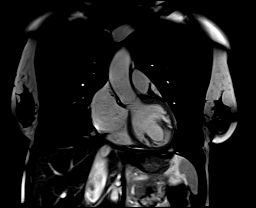

[Series 49: aortic valve cine · oblique · 6.0mm · 1.41mm/px · 1 of 25 slices shown]
[im 1/25]
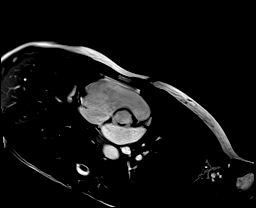

[Series 50: cine rvit · coronal · 6.0mm · 1.41mm/px · 1 of 25 slices shown]
[im 1/25]
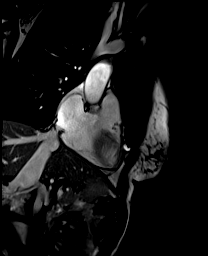

[Series 51: cine rvot · sagittal · 6.0mm · 1.41mm/px · 1 of 25 slices shown]
[im 1/25]
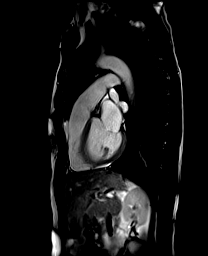

[Series 53: lge_single shot sa · oblique · 8.0mm · 1.88mm/px · 1 of 18 slices shown (1 of 2)]
[im 1/18]
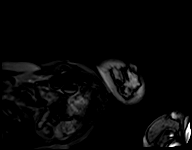

[Series 54: lge_single shot sa · oblique · 8.0mm · 1.88mm/px · 1 of 18 slices shown (2 of 2)]
[im 1/18]
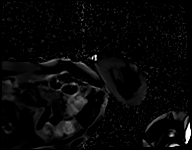

[Series 65: (id)_short_t1 · oblique · 8.0mm · 1.56mm/px · 1 of 27 slices shown]
[im 1/27]
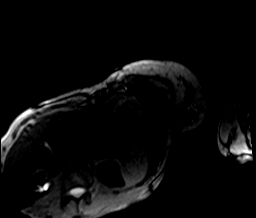

[Series 66: (id)_short_t1_moco · oblique · 8.0mm · 1.56mm/px · 1 of 27 slices shown]
[im 1/27]
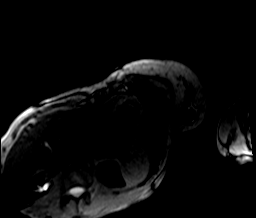

[Series 67: (id)_short_t1_moco_t1 · oblique · 8.0mm · 1.56mm/px · 1 of 5 slices shown]
[im 1/5]
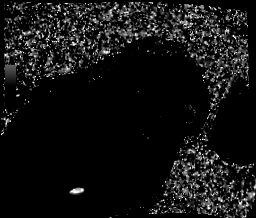

[45 of 48 positions shown; findings below may reference images not displayed]

FINDINGS: Normal cardiac chamber sizes. No effusion. No ASD/PFO. Normal
ascending thoracic aorta 3.1 cm Normal tri leaflet AV. MV mildly
thickened with no prolapse and no significant MR Normal TV and PV.
Normal RVOT. Normal RV size and function with no diverticula or
evidence of RV dysplasia. Quantitative RVEF 56% (EDV 116 cc ESV 51
cc SV 65 cc) Normal LV size and function No hypertrophy septal
thickness 7 mm. Quantitative EF 55% (EDV 133 cc ESV 60 cc SV 73 cc)
Delayed gadolinium images with no scar, infiltration or infarct
Parametric measures T1 [C9] msec ECV using Hct 39 45%, T2 72 msec
IMPRESSION: 1.  Normal LV size and function EF 55% no LVH, No RWMAls

2.  Normal RV size and function RVEF 56% no evidence of RV dysplasia

3.  Normal AV,TV,PV mild thickening of MV with no prolapse

4.  Normal ascending thoracic aorta 3.1 cm

5. No delayed gadolinium uptake on inversion recovery sequences with
good myocardial nulling

6. Increased T1, ECV and T2 in absence of any morphologic
abnormalities indeterminate sign Suggest f/u MRI in a year

RATLABALA

## 2021-08-12 MED ORDER — GADOBUTROL 1 MMOL/ML IV SOLN
6.0000 mL | Freq: Once | INTRAVENOUS | Status: AC | PRN
  Administered 2021-08-12: 6 mL via INTRAVENOUS

## 2021-08-16 NOTE — Telephone Encounter (Signed)
Chilton Si, MD to Julio Sicks, RN   6:02 AM She can have 1 mg but I'm not comfortable giving anything stronger.   TCR

## 2021-08-16 NOTE — Telephone Encounter (Signed)
Left message for patient of dr [place]'s recommendations. 

## 2021-08-19 ENCOUNTER — Other Ambulatory Visit: Payer: Self-pay | Admitting: Cardiology

## 2021-08-26 ENCOUNTER — Ambulatory Visit (HOSPITAL_BASED_OUTPATIENT_CLINIC_OR_DEPARTMENT_OTHER): Payer: BC Managed Care – PPO | Admitting: Cardiovascular Disease

## 2021-09-05 ENCOUNTER — Other Ambulatory Visit (HOSPITAL_BASED_OUTPATIENT_CLINIC_OR_DEPARTMENT_OTHER): Payer: Self-pay | Admitting: *Deleted

## 2021-09-05 DIAGNOSIS — I5032 Chronic diastolic (congestive) heart failure: Secondary | ICD-10-CM

## 2021-09-23 ENCOUNTER — Other Ambulatory Visit: Payer: Self-pay | Admitting: Cardiology

## 2021-09-28 ENCOUNTER — Other Ambulatory Visit: Payer: Self-pay

## 2021-09-28 ENCOUNTER — Encounter (HOSPITAL_BASED_OUTPATIENT_CLINIC_OR_DEPARTMENT_OTHER): Payer: Self-pay | Admitting: Cardiovascular Disease

## 2021-09-28 ENCOUNTER — Ambulatory Visit (HOSPITAL_BASED_OUTPATIENT_CLINIC_OR_DEPARTMENT_OTHER): Payer: BC Managed Care – PPO | Admitting: Cardiovascular Disease

## 2021-09-28 DIAGNOSIS — E7800 Pure hypercholesterolemia, unspecified: Secondary | ICD-10-CM | POA: Insufficient documentation

## 2021-09-28 DIAGNOSIS — R413 Other amnesia: Secondary | ICD-10-CM

## 2021-09-28 DIAGNOSIS — I4729 Other ventricular tachycardia: Secondary | ICD-10-CM

## 2021-09-28 DIAGNOSIS — I5032 Chronic diastolic (congestive) heart failure: Secondary | ICD-10-CM

## 2021-09-28 HISTORY — DX: Pure hypercholesterolemia, unspecified: E78.00

## 2021-09-28 HISTORY — DX: Other amnesia: R41.3

## 2021-09-28 MED ORDER — FUROSEMIDE 20 MG PO TABS: 20.0000 mg | ORAL_TABLET | ORAL | 2 refills | Status: DC | PRN

## 2021-09-28 NOTE — Assessment & Plan Note (Signed)
She is noted memory loss that she thinks is atypical for age.  Did recommend referral to neurology.  She is going to look up which neurologist she wants to see and will let us know.

## 2021-09-28 NOTE — Assessment & Plan Note (Addendum)
Overall stable.  Continue metoprolol.  Unable to escalate the dose due to bradycardia and fatigue.  We discussed referral to EP and agree that it is unnecessary at this time.  Overall PVC/NSVT burden was very low.

## 2021-09-28 NOTE — Assessment & Plan Note (Addendum)
Grade 2 diastolic dysfunction and elevated right atrial pressures on echo.  We reviewed her images in the office today.  Again, it I do not have any great examinations for why she has grade 2 diastolic dysfunction.  I do think that she has symptoms of heart failure given that she is had shortness of breath and evidence of volume overload.  She did not improve significantly with Lasix.  We did get a cardiac MRI that did not have any specific findings but had some mild uptake abnormalities.  There was no delayed enhancement or evidence of scarring.  Plan to repeat her cardiac MRI in 1 year.  We reviewed the fact that heart failure is a spectrum and she is overall very mild.  We discussed limiting sodium intake and fluid intake.  We will give her Lasix 20 mg to have as needed for shortness of breath, weight gain of 2 pounds in a day or 5 pounds in a week.

## 2021-09-28 NOTE — Assessment & Plan Note (Signed)
Lipids were elevated.  However her ASCVD 10-year risk is 1.1% and her calcium score is 0.  Continue working on diet and exercise and no statins for now.

## 2021-09-28 NOTE — Patient Instructions (Addendum)
Medication Instructions:  USE FUROSEMIDE AS NEEDED FOR WEIGHT GAIN OF 2 LBS IN 24 HOURS/5 POUNDS IN A WEEK, SWELLING OR SHORTNESS OF BREATH   *If you need a refill on your cardiac medications before your next appointment, please call your pharmacy*  Lab Work: NONE  Testing/Procedures: CARDIAC MRI IN November PRIOR TO YOUR FOLLOW UP   Follow-Up: At [facility], you and your health needs are our priority.  As part of our continuing mission to provide you with exceptional heart care, we have created designated Provider Care Teams.  These Care Teams include your primary Cardiologist (physician) and Advanced Practice Providers (APPs -  Physician Assistants and Nurse Practitioners) who all work together to provide you with the care you need, when you need it.  We recommend signing up for the patient portal called "MyChart".  Sign up information is provided on this After Visit Summary.  MyChart is used to connect with patients for Virtual Visits (Telemedicine).  Patients are able to view lab/test results, encounter notes, upcoming appointments, etc.  Non-urgent messages can be sent to your provider as well.   To learn more about what you can do with MyChart, go to [email or web address].    Your next appointment:   WITH [facility] IN November AFTER MRI

## 2021-09-28 NOTE — Progress Notes (Signed)
Cardiology Office Note:    Date:  09/28/2021   ID:  Michelle Kramer, DOB 1966/03/21, MRN [id number]  PCP:  Leane Call, PA-C   [facility] Providers Cardiologist:  None     Referring MD: Leane Call, PA-C   No chief complaint on file.   History of Present Illness:    Michelle Kramer is a 56 y.o. female with a hx of anemia, anxiety, murmurs, hypertension, insomnia, and mitral valve prolapse coming in today for evaluation. She previously saw Dr. Anne Fu 07/2020 with palpitation and shortness of breath. She reported that her sons have ascending aortic aneuryms followed by regular echos. She has hx of anxiety and mitral valve prolapse. She was referred for a 14 day Zio monitor which had multiple paroxysmal svt and atrial tachycardia. She also had 9 beats of NSVT. She had an echo 10/01/2020 showing LVF 50-60% and grade 2 diastolic dysfunction. Her R atrial pressure was . She had a Coronary CTA 09/2020 with no evidence of coronary artery disease. She had a CPX 03/2021 with a blunted heart rate response during exercise and 2 minutes of SVT during recovery.  At her last appointment she continued to report shortness of breath and palpitations.  Given that her echo showed diastolic dysfunction and elevated right atrial pressures she was started on Lasix.  She also had a cardiac MRI that revealed LVEF 55% with mild thickening of the mitral valve but no prolapse.  There is no late gadolinium enhancement, however there is increased T1, ECV, and T2.  These were thought to be nonspecific findings and recommended repeat MRI in a year.  We did discuss referral to EP for management of her tachyarrhythmias, but she declined.  She didn't notice any improvement after taking lasix.  Overall she has felt well.  She walks in her neighborhood about 4 to 5 days/week for 30 minutes.  She notes that she gets more tired than she thinks someone her age should.  She does not have any shortness of breath or  chest pain.  She has noted occasional palpitations that seem to be worse when laying in bed at night, especially when she lays on her left side.  They do not bother her as much throughout the day.  She has noted some troubles with memory loss and difficulty processing.  She thinks that it is more advanced than she would expect for her age.  Past Medical History:  Diagnosis Date   Anemia    Anxiety    Chronic UTI    Headache    Heart murmur    Hypertension    only with prior OCP use   Insomnia    Memory loss 09/28/2021   MVP (mitral valve prolapse)    NSVT (nonsustained ventricular tachycardia) 07/12/2021   Post-operative state - [facility] w/ bilat salpingectomy 05/22/2012   Premature ovarian failure    resolved 03/2011 with normal FSH/estradiol   Pure hypercholesterolemia 09/28/2021    Past Surgical History:  Procedure Laterality Date   ABDOMINAL HYSTERECTOMY     BUNIONECTOMY     right foot   HYSTEROSCOPY  2002   LAPAROSCOPIC SUPRACERVICAL HYSTERECTOMY  05/21/2012   Procedure: LAPAROSCOPIC SUPRACERVICAL HYSTERECTOMY;  Surgeon: Tresa Endo A. Ernestina Penna, MD;  Location: [facility];  Service: Gynecology;  Laterality: N/A;   ~Laprascopic Supracervical Hysterectomy Attempted, Converted into a Robotic Assisted Hysterectomy  Laprascopic Supracervical Hysterectomy with Morcellation Possible Robotic Assistance Book in Robot Room Do Not Drape Robot     Current Medications:  Current Meds  Medication Sig   CALCIUM-VITAMIN D PO Take 2 tablets by mouth daily.   clonazePAM (KLONOPIN) 0.5 MG tablet Take 0.5 mg by mouth at bedtime as needed for anxiety.   estradiol (VIVELLE-DOT) 0.1 MG/24HR patch Place 1 patch onto the skin 2 (two) times a week.   furosemide (LASIX) 20 MG tablet Take 20 mg by mouth as needed (FOR SWELLING OR SHORTNESS OF BREATH).   linaclotide (LINZESS) 145 MCG CAPS capsule Take 145 mcg by mouth as needed.   LORazepam (ATIVAN) 0.5 MG tablet TAKE 1 TABLET 30 MINUTES PRIOR TO MRI AND MAY TAKE  ANOTHER WHILE ON THE TABLE IF NEEDED   metoprolol succinate (TOPROL-XL) 25 MG 24 hr tablet TAKE 1 TABLET BY MOUTH EVERY DAY   OVER THE COUNTER MEDICATION Magnesium-Take 1 tablet by mouth daily.   valACYclovir (VALTREX) 1000 MG tablet Take 1,000 mg by mouth as needed.   zolpidem (AMBIEN) 5 MG tablet Take by mouth.   [DISCONTINUED] furosemide (LASIX) 20 MG tablet Take 1 tablet by mouth for 2 days and then as needed for swelling or shortness of breath (Patient taking differently: Take 20 mg by mouth as needed (SHORTNESS OF BREATH OR SWELLING). Take 1 tablet by mouth for 2 days and then as needed for swelling or shortness of breath)     Allergies:   Codeine, Oxycodone, Oxycodone-acetaminophen, and Vicodin [hydrocodone-acetaminophen]   Social History   Socioeconomic History   Marital status: Single    Spouse name: Not on file   Number of children: Not on file   Years of education: Not on file   Highest education level: Not on file  Occupational History   Not on file  Tobacco Use   Smoking status: Former    Packs/day: 0.50    Years: 6.00    Pack years: 3.00    Types: Cigarettes    Quit date: 28    Years since quitting: 29.0   Smokeless tobacco: Never  Substance and Sexual Activity   Alcohol use: Yes    Comment: occasional   Drug use: No   Sexual activity: Yes    Birth control/protection: Surgical    Comment: vasectomy  Other Topics Concern   Not on file  Social History Narrative   Lives with fiance of 6 years, 3 children at home, 2 biological, 1 step, [employer], exercises some   Social Determinants of Health   Financial Resource Strain: Low Risk    Difficulty of Paying Living Expenses: Not hard at all  Food Insecurity: No Food Insecurity   Worried About Programme researcher, broadcasting/film/video in the Last Year: Never true   Barista in the Last Year: Never true  Transportation Needs: No Transportation Needs   Lack of Transportation (Medical): No   Lack of Transportation (Non-Medical):  No  Physical Activity: Sufficiently Active   Days of Exercise per Week: 5 days   Minutes of Exercise per Session: 50 min  Stress: Not on file  Social Connections: Not on file     Family History: The patient's family history includes Heart disease in her maternal grandmother; Heart failure in her mother; Hypertension in her maternal grandmother and mother; Lung cancer in her mother; Lupus in her sister. She was adopted.  ROS:   Please see the history of present illness.    (+) Fatigue (+) Shortness of breath (+) Dizziness (+) Palpitations (+) Chest Tightness All other systems reviewed and are negative.  EKGs/Labs/Other Studies Reviewed:  The following studies were reviewed today: Exercise Test 04/05/2021 Conclusion: Exercise testing with gas exchange demonstrates normal functional capacity when compared to matched sedentary norms. There is no indication for cardiopulmonary abnormality. However, there was blunted heart rate response to exercise and a brief run of SVT that initiated and resolved during active recovery that could be contributing to patients exertional symptoms.   CT Coronary 06/14/2021 IMPRESSION: 1. Coronary calcium score of 0. This was 0 percentile for age and sex matched control.   2. Normal coronary origin.   3. No evidence of CAD.   4. The timing of the contrast bolus is poor. Unable to fully visualize distal vessels. Given that there are no coronary calcifications, it is unlikely that there are any significant stenoses. However, given that the mid to distal arteries are not well-opacified, cannot fully rule out coronary artery disease.   5.  Consider non-cardiac causes of chest pain.  Echo 10/01/2020 1. Left ventricular ejection fraction, by estimation, is 55 to 60%. The  left ventricle has normal function. The left ventricle has no regional  wall motion abnormalities. Left ventricular diastolic parameters are  consistent with Grade II diastolic   dysfunction (pseudonormalization).   2. Right ventricular systolic function is normal. The right ventricular  size is normal. There is normal pulmonary artery systolic pressure.   3. Mitral valve prolapse not present. The mitral valve is normal in  structure. Trivial mitral valve regurgitation. No evidence of mitral  stenosis.   4. The aortic valve is tricuspid. Aortic valve regurgitation is not  visualized. No aortic stenosis is present.   5. The inferior vena cava is dilated in size with <50% respiratory  variability, suggesting right atrial pressure of 15 mmHg.   Cardiac MRI 08/12/21: IMPRESSION: 1.  Normal LV size and function EF 55% no LVH, No RWMAls   2.  Normal RV size and function RVEF 56% no evidence of RV dysplasia   3.  Normal AV,TV,PV mild thickening of MV with no prolapse   4.  Normal ascending thoracic aorta 3.1 cm   5. No delayed gadolinium uptake on inversion recovery sequences with good myocardial nulling   6. Increased T1, ECV and T2 in absence of any morphologic abnormalities indeterminate sign Suggest f/u MRI in a year  EKG:   07/12/2021: Sinus bradycardia Rate 54 bpm  Recent Labs: 07/12/2021: BNP 69.3 08/02/2021: BUN 11; Creatinine, Ser 0.86; Hemoglobin 13.2; Platelets 271; Potassium 4.8; Sodium 138  Recent Lipid Panel No results found for: CHOL, TRIG, HDL, CHOLHDL, VLDL, LDLCALC, LDLDIRECT  05/24/21: Total cholesterol 222, triglycerides 114, HDL 88, LDL 114 Hemoglobin A1c 5.9%      Physical Exam:    VS:  BP 120/82 (BP Location: Left Arm, Patient Position: Sitting, Cuff Size: Normal)    Pulse (!) 58    Ht 5\' 4"  (1.626 m)    Wt 116 lb (52.6 kg)    LMP 05/09/2012    SpO2 98%    BMI 19.91 kg/m  , BMI Body mass index is 19.91 kg/m. GENERAL:  Well appearing HEENT: Pupils equal round and reactive, fundi not visualized, oral mucosa unremarkable NECK:  No jugular venous distention, waveform within normal limits, carotid upstroke brisk and symmetric, no  bruits, no thyromegaly LUNGS:  Clear to auscultation bilaterally HEART:  RRR.  PMI not displaced or sustained,S1 and S2 within normal limits, no S3, no S4, no clicks, no rubs, no murmurs ABD:  Flat, positive bowel sounds normal in frequency in pitch, no bruits,  no rebound, no guarding, no midline pulsatile mass, no hepatomegaly, no splenomegaly EXT:  2 plus pulses throughout, no edema, no cyanosis no clubbing SKIN:  No rashes no nodules NEURO:  Cranial nerves II through XII grossly intact, motor grossly intact throughout PSYCH:  Cognitively intact, oriented to person place and time   ASSESSMENT:    1. Chronic diastolic heart failure (HCC)   2. NSVT (nonsustained ventricular tachycardia)   3. Memory loss   4. Pure hypercholesterolemia     PLAN:   Chronic diastolic heart failure (HCC) Grade 2 diastolic dysfunction and elevated right atrial pressures on echo.  We reviewed her images in the office today.  Again, it I do not have any great examinations for why she has grade 2 diastolic dysfunction.  I do think that she has symptoms of heart failure given that she is had shortness of breath and evidence of volume overload.  She did not improve significantly with Lasix.  We did get a cardiac MRI that did not have any specific findings but had some mild uptake abnormalities.  There was no delayed enhancement or evidence of scarring.  Plan to repeat her cardiac MRI in 1 year.  We reviewed the fact that heart failure is a spectrum and she is overall very mild.  We discussed limiting sodium intake and fluid intake.  We will give her Lasix 20 mg to have as needed for shortness of breath, weight gain of 2 pounds in a day or 5 pounds in a week.  NSVT (nonsustained ventricular tachycardia) Overall stable.  Continue metoprolol.  Unable to escalate the dose due to bradycardia and fatigue.  We discussed referral to EP and agree that it is unnecessary at this time.  Overall PVC/NSVT burden was very  low.  Memory loss She is noted memory loss that she thinks is atypical for age.  Did recommend referral to neurology.  She is going to look up which neurologist she wants to see and will let us know.  Pure hypercholesterolemia Lipids were elevated.  However her ASCVD 10-year risk is 1.1% and her calcium score is 0.  Continue working on diet and exercise and no statins for now.    Medication Adjustments/Labs and Tests Ordered: Current medicines are reviewed at length with the patient today.  Concerns regarding medicines are outlined above.  No orders of the defined types were placed in this encounter.   No orders of the defined types were placed in this encounter.    Patient Instructions  Medication Instructions:  Your physician recommends that you continue on your current medications as directed. Please refer to the Current Medication list given to you today.   *If you need a refill on your cardiac medications before your next appointment, please call your pharmacy*   Lab Work: NONE  If you have labs (blood work) drawn today and your tests are completely normal, you will receive your results only by: MyChart Message (if you have MyChart) OR A paper copy in the mail If you have any lab test that is abnormal or we need to change your treatment, we will call you to review the results.   Testing/Procedures: CARDIAC MRI IN November PRIOR TO YOUR FOLLOW UP   Follow-Up: At [facility], you and your health needs are our priority.  As part of our continuing mission to provide you with exceptional heart care, we have created designated Provider Care Teams.  These Care Teams include your primary Cardiologist (physician) and Advanced Practice Providers (  APPs -  Physician Assistants and Nurse Practitioners) who all work together to provide you with the care you need, when you need it.  We recommend signing up for the patient portal called "MyChart".  Sign up information is provided on  this After Visit Summary.  MyChart is used to connect with patients for Virtual Visits (Telemedicine).  Patients are able to view lab/test results, encounter notes, upcoming appointments, etc.  Non-urgent messages can be sent to your provider as well.   To learn more about what you can do with MyChart, go to [email or web address].    Your next appointment:   WITH DR [facility] IN November AFTER MRI     Disposition: FU with Ileene Allie C. Duke Salvia, MD, [facility] in 2-3 months   I,Mykaella Javier,acting as a scribe for Chilton Si, MD.,have documented all relevant documentation on the behalf of Chilton Si, MD,as directed by  Chilton Si, MD while in the presence of Chilton Si, MD.   Time spent: 50 minutes-Greater than 50% of this time was spent in counseling, explanation of diagnosis, planning of further management, and coordination of care.   Signed, Chilton Si, MD  09/28/2021 8:27 AM    [facility]

## 2021-09-30 DIAGNOSIS — N760 Acute vaginitis: Secondary | ICD-10-CM | POA: Diagnosis not present

## 2021-09-30 DIAGNOSIS — L292 Pruritus vulvae: Secondary | ICD-10-CM | POA: Diagnosis not present

## 2021-10-07 ENCOUNTER — Ambulatory Visit (HOSPITAL_BASED_OUTPATIENT_CLINIC_OR_DEPARTMENT_OTHER): Payer: BC Managed Care – PPO | Admitting: Cardiovascular Disease

## 2021-11-30 ENCOUNTER — Encounter (HOSPITAL_BASED_OUTPATIENT_CLINIC_OR_DEPARTMENT_OTHER): Payer: Self-pay | Admitting: Cardiovascular Disease

## 2021-11-30 DIAGNOSIS — R413 Other amnesia: Secondary | ICD-10-CM

## 2021-12-16 ENCOUNTER — Encounter: Payer: Self-pay | Admitting: Plastic Surgery

## 2021-12-23 ENCOUNTER — Ambulatory Visit (INDEPENDENT_AMBULATORY_CARE_PROVIDER_SITE_OTHER): Payer: Self-pay | Admitting: Plastic Surgery

## 2021-12-23 ENCOUNTER — Other Ambulatory Visit: Payer: Self-pay

## 2021-12-23 ENCOUNTER — Encounter: Payer: Self-pay | Admitting: Plastic Surgery

## 2021-12-23 DIAGNOSIS — Z719 Counseling, unspecified: Secondary | ICD-10-CM

## 2021-12-23 NOTE — Progress Notes (Signed)
Botulinum Toxin Procedure Note ? ?Procedure: Cosmetic botulinum toxin  ? ?Pre-operative Diagnosis: Dynamic rhytides  ? ?Post-operative Diagnosis: Same ? ?Complications:  None ? ?Brief history: The patient desires botulinum toxin injection of her forehead. I discussed with the patient this proposed procedure of botulinum toxin injections, which is customized depending on the particular needs of the patient. It is performed on facial rhytids as a temporary correction. The alternatives were discussed with the patient. The risks were addressed including bleeding, scarring, infection, damage to deeper structures, asymmetry, and chronic pain, which may occur infrequently after a procedure. The individual's choice to undergo a surgical procedure is based on the comparison of risks to potential benefits. Other risks include unsatisfactory results, brow ptosis, eyelid ptosis, allergic reaction, temporary paralysis, which should go away with time, bruising, blurring disturbances and delayed healing. Botulinum toxin injections do not arrest the aging process or produce permanent tightening of the eyelid.  Operative intervention maybe necessary to maintain the results of a blepharoplasty or botulinum toxin. The patient understands and wishes to proceed. ? ?Procedure: The area was prepped with alcohol and dried with a clean gauze. Using a clean technique, the botulinum toxin was diluted with 1.25 cc of preservative-free normal saline which was slowly injected with an 18 gauge needle in a tuberculin syringes.  A 32 gauge needles were then used to inject the botulinum toxin. This mixture allow for an aliquot of 4 units per 0.1 cc in each injection site.   ? ?Subsequently the mixture was injected in the glabellar and forehead area with preservation of the temporal branch to the lateral eyebrow as well as into each lateral canthal area beginning from the lateral orbital rim medial to the zygomaticus major in 3 separate areas. A  total of 36 Units of botulinum toxin was used. The forehead and glabellar area was injected with care to inject intramuscular only while holding pressure on the supratrochlear vessels in each area during each injection on either side of the medial corrugators. The injection proceeded vertically superiorly to the medial 2/3 of the frontalis muscle and superior 2/3 of the lateral frontalis, again with preservation of the frontal branch.  No complications were noted. Light pressure was held for 5 minutes. She was instructed explicitly in post-operative care. ? ?Botox ?LOT:  [id number] ?EXP:  25/04 ? ? ?

## 2021-12-25 ENCOUNTER — Other Ambulatory Visit (HOSPITAL_BASED_OUTPATIENT_CLINIC_OR_DEPARTMENT_OTHER): Payer: Self-pay | Admitting: Cardiovascular Disease

## 2022-01-04 ENCOUNTER — Encounter (HOSPITAL_BASED_OUTPATIENT_CLINIC_OR_DEPARTMENT_OTHER): Payer: Self-pay | Admitting: Cardiovascular Disease

## 2022-01-04 NOTE — Telephone Encounter (Signed)
Please advise 

## 2022-01-17 DIAGNOSIS — D2261 Melanocytic nevi of right upper limb, including shoulder: Secondary | ICD-10-CM | POA: Diagnosis not present

## 2022-01-17 DIAGNOSIS — L720 Epidermal cyst: Secondary | ICD-10-CM | POA: Diagnosis not present

## 2022-01-17 DIAGNOSIS — L819 Disorder of pigmentation, unspecified: Secondary | ICD-10-CM | POA: Diagnosis not present

## 2022-01-17 DIAGNOSIS — L821 Other seborrheic keratosis: Secondary | ICD-10-CM | POA: Diagnosis not present

## 2022-02-23 ENCOUNTER — Encounter (HOSPITAL_BASED_OUTPATIENT_CLINIC_OR_DEPARTMENT_OTHER): Payer: Self-pay

## 2022-02-27 ENCOUNTER — Other Ambulatory Visit: Payer: Self-pay | Admitting: Gastroenterology

## 2022-02-27 ENCOUNTER — Ambulatory Visit
Admission: RE | Admit: 2022-02-27 | Discharge: 2022-02-27 | Disposition: A | Discharge status: Home / Self Care | Payer: BC Managed Care – PPO | Source: Ambulatory Visit | Attending: Gastroenterology | Admitting: Gastroenterology

## 2022-02-27 DIAGNOSIS — K5900 Constipation, unspecified: Secondary | ICD-10-CM | POA: Diagnosis not present

## 2022-02-27 IMAGING — CR DG ABDOMEN 2V
2 series · 2 of 2 positions shown · non-contrast
Comparison: None Available.

CLINICAL DATA: Constipation, unspecified constipation type

EXAM:
ABDOMEN - 2 VIEW

[w abdomen upright]
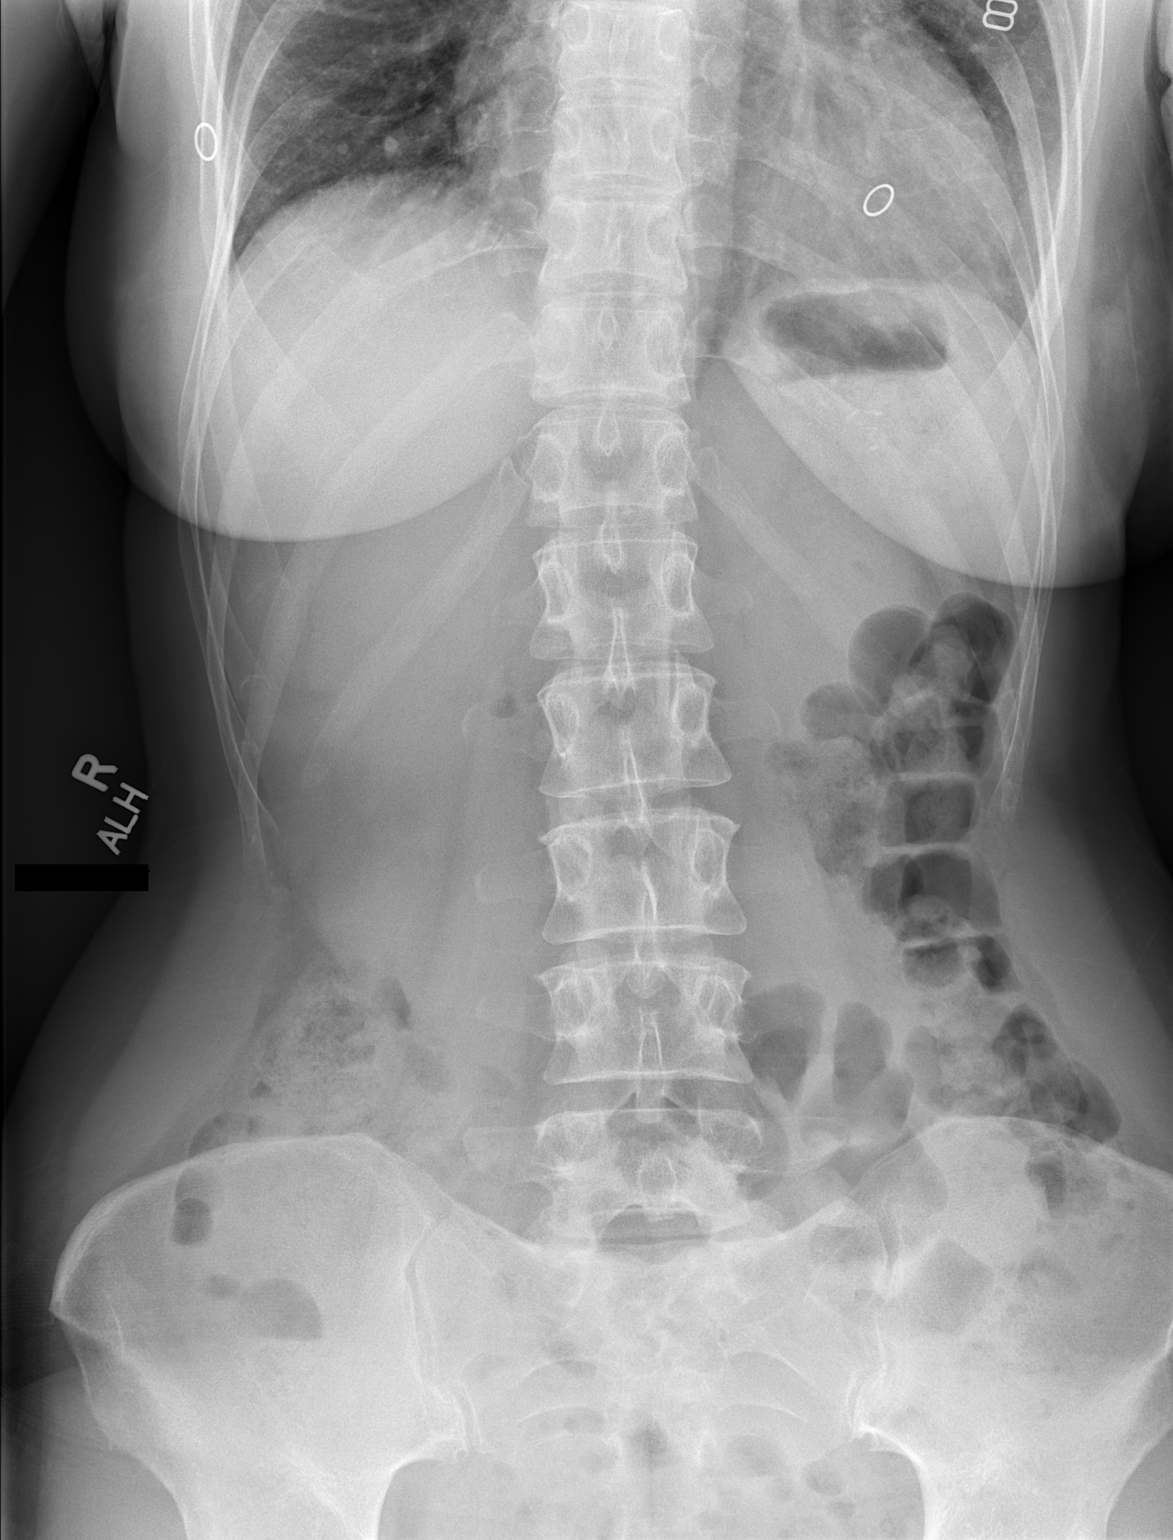

[t abdomen supine]
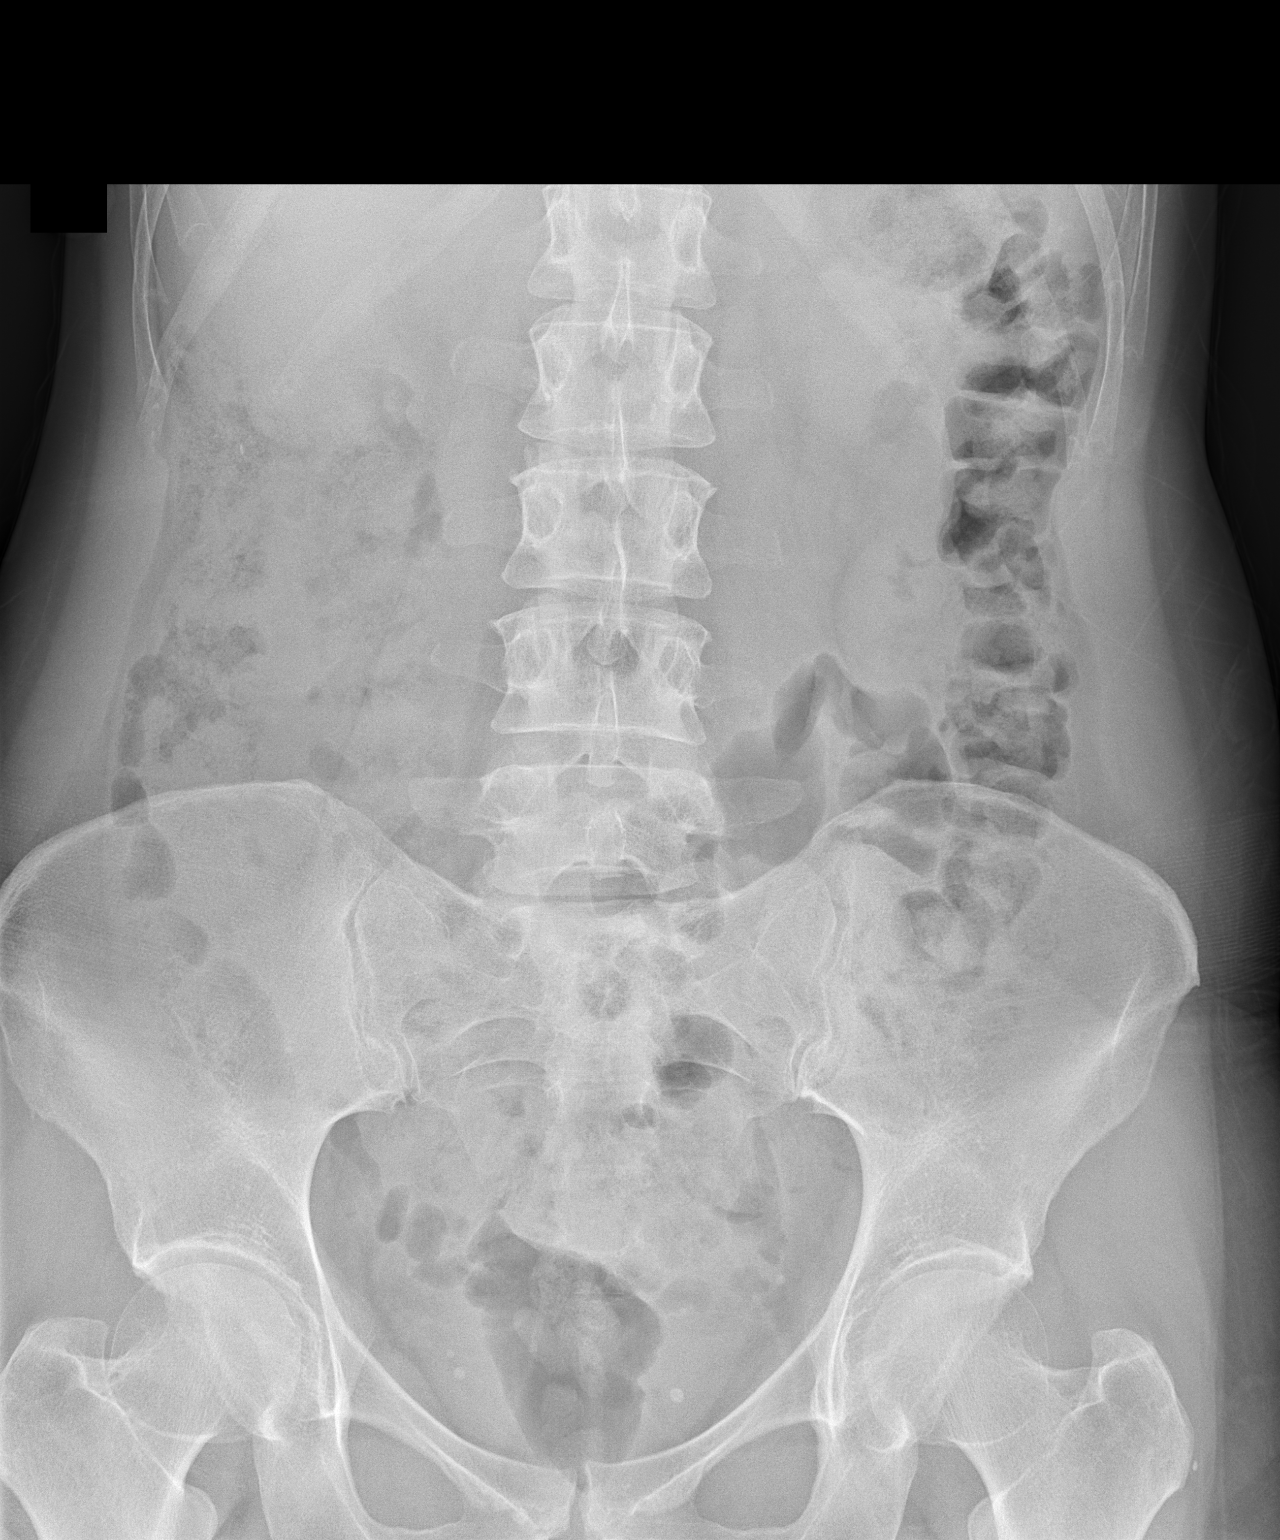

[2 of 2 positions shown; findings below may reference images not displayed]

FINDINGS: The bowel gas pattern is normal. There is no evidence of free air.
No radio-opaque calculi or other significant radiographic
abnormality is seen. There is small amount of stool burden seen in
the colon.
IMPRESSION: Unremarkable two views of the abdomen

## 2022-02-27 NOTE — Telephone Encounter (Signed)
Hey there, this patient messaged you back.

## 2022-02-28 ENCOUNTER — Telehealth: Payer: Self-pay | Admitting: Cardiovascular Disease

## 2022-02-28 NOTE — Telephone Encounter (Signed)
Pt called to give an updated fax number for [facility]. The fax previously went to [facility], but she does not wish to go there, please fax records to this number   [phone]

## 2022-02-28 NOTE — Telephone Encounter (Signed)
Hey, what all records do I need to send, I can refax them.

## 2022-03-30 ENCOUNTER — Other Ambulatory Visit: Payer: Self-pay | Admitting: Cardiology

## 2022-03-30 DIAGNOSIS — M79662 Pain in left lower leg: Secondary | ICD-10-CM | POA: Diagnosis not present

## 2022-03-30 DIAGNOSIS — Z862 Personal history of diseases of the blood and blood-forming organs and certain disorders involving the immune mechanism: Secondary | ICD-10-CM | POA: Diagnosis not present

## 2022-03-30 DIAGNOSIS — M79604 Pain in right leg: Secondary | ICD-10-CM | POA: Diagnosis not present

## 2022-03-30 DIAGNOSIS — M79661 Pain in right lower leg: Secondary | ICD-10-CM | POA: Diagnosis not present

## 2022-03-30 DIAGNOSIS — M8580 Other specified disorders of bone density and structure, unspecified site: Secondary | ICD-10-CM | POA: Diagnosis not present

## 2022-03-30 DIAGNOSIS — M79651 Pain in right thigh: Secondary | ICD-10-CM | POA: Diagnosis not present

## 2022-03-30 DIAGNOSIS — M79652 Pain in left thigh: Secondary | ICD-10-CM | POA: Diagnosis not present

## 2022-03-30 DIAGNOSIS — M79605 Pain in left leg: Secondary | ICD-10-CM | POA: Diagnosis not present

## 2022-04-07 ENCOUNTER — Ambulatory Visit (INDEPENDENT_AMBULATORY_CARE_PROVIDER_SITE_OTHER): Payer: Self-pay | Admitting: Plastic Surgery

## 2022-04-07 ENCOUNTER — Encounter: Payer: Self-pay | Admitting: Plastic Surgery

## 2022-04-07 VITALS — BP 111/65 | HR 51 | Ht 64.0 in | Wt 119.4 lb

## 2022-04-07 DIAGNOSIS — Z719 Counseling, unspecified: Secondary | ICD-10-CM

## 2022-04-07 NOTE — Progress Notes (Signed)
Dysport Procedure Note  Procedure: Cosmetic botulinum toxin   Pre-operative Diagnosis: Dynamic rhytides   Post-operative Diagnosis: Same  Complications:  None  Brief history: The patient desires botulinum toxin injection of her forehead. I discussed with the patient this proposed procedure of botulinum toxin injections, which is customized depending on the particular needs of the patient. It is performed on facial rhytids as a temporary correction. The alternatives were discussed with the patient. The risks were addressed including bleeding, scarring, infection, damage to deeper structures, asymmetry, and chronic pain, which may occur infrequently after a procedure. The individual's choice to undergo a surgical procedure is based on the comparison of risks to potential benefits. Other risks include unsatisfactory results, brow ptosis, eyelid ptosis, allergic reaction, temporary paralysis, which should go away with time, bruising, blurring disturbances and delayed healing. Botulinum toxin injections do not arrest the aging process or produce permanent tightening of the eyelid.  Operative intervention maybe necessary to maintain the results of a blepharoplasty or botulinum toxin. The patient understands and wishes to proceed. Consent was given.  Procedure: The area was prepped with alcohol and dried with a clean gauze. Using a clean technique, the botulinum toxin was diluted with 3 cc of preservative-free normal saline which was slowly injected with an 18 gauge needle in a tuberculin syringes.  A 32 gauge needles were then used to inject the botulinum toxin. This mixture allow for an aliquot of 10 units per 0.1 cc in each injection site.    Subsequently the mixture was injected in the glabellar and forehead area with preservation of the temporal branch to the lateral eyebrow as well as into each lateral canthal area beginning from the lateral orbital rim medial to the zygomaticus major in 3 separate  areas. A total of 100 Units of botulinum toxin was used. The forehead and glabellar area was injected with care to inject intramuscular only while holding pressure on the supratrochlear vessels in each area during each injection on either side of the medial corrugators. The injection proceeded vertically superiorly to the medial 2/3 of the frontalis muscle and superior 2/3 of the lateral frontalis, again with preservation of the frontal branch.  No complications were noted. Light pressure was held for 5 minutes. She was instructed explicitly in post-operative care.  Dysport LOT:  [id number] EXP:  07/25/22 

## 2022-04-25 ENCOUNTER — Telehealth (HOSPITAL_BASED_OUTPATIENT_CLINIC_OR_DEPARTMENT_OTHER): Payer: Self-pay | Admitting: Cardiovascular Disease

## 2022-04-25 NOTE — Telephone Encounter (Signed)
Pt c/o medication issue:  1. Name of Medication:   Cyclobenzaprine  2. How are you currently taking this medication (dosage and times per day)?   No  3. Are you having a reaction (difficulty breathing--STAT)? N/A  4. What is your medication issue?   Patient called stating she was prescribed this medication by another doctor for neck pain and is concerned this medication is contraindicated with her heart medicine.  Please advise.

## 2022-04-25 NOTE — Telephone Encounter (Signed)
Please advise if okay to take med?

## 2022-04-26 NOTE — Telephone Encounter (Signed)
Concurrent use of CLONAZEPAM and cyclobenzaprine may result in increased risk of CNS depression.   Concurrent use of ZOLPIDEM and cyclobenzaprine may result in an increased risk of CNS depression, complex sleep behaviors and respiratory depression.

## 2022-04-26 NOTE — Telephone Encounter (Signed)
Responded to patient via mychart message with PharmD recommendation   "Concurrent use of ZOLPIDEM and cyclobenzaprine may result in an increased risk of CNS depression, complex sleep behaviors and respiratory depression."

## 2022-05-01 DIAGNOSIS — H6691 Otitis media, unspecified, right ear: Secondary | ICD-10-CM | POA: Diagnosis not present

## 2022-05-01 DIAGNOSIS — J069 Acute upper respiratory infection, unspecified: Secondary | ICD-10-CM | POA: Diagnosis not present

## 2022-05-01 DIAGNOSIS — H6121 Impacted cerumen, right ear: Secondary | ICD-10-CM | POA: Diagnosis not present

## 2022-05-01 DIAGNOSIS — R051 Acute cough: Secondary | ICD-10-CM | POA: Diagnosis not present

## 2022-05-17 DIAGNOSIS — H6123 Impacted cerumen, bilateral: Secondary | ICD-10-CM | POA: Diagnosis not present

## 2022-05-17 DIAGNOSIS — H6121 Impacted cerumen, right ear: Secondary | ICD-10-CM | POA: Diagnosis not present

## 2022-05-17 DIAGNOSIS — H6122 Impacted cerumen, left ear: Secondary | ICD-10-CM | POA: Diagnosis not present

## 2022-06-15 DIAGNOSIS — K581 Irritable bowel syndrome with constipation: Secondary | ICD-10-CM | POA: Diagnosis not present

## 2022-06-15 DIAGNOSIS — E782 Mixed hyperlipidemia: Secondary | ICD-10-CM | POA: Diagnosis not present

## 2022-06-15 DIAGNOSIS — I10 Essential (primary) hypertension: Secondary | ICD-10-CM | POA: Diagnosis not present

## 2022-06-15 DIAGNOSIS — R140 Abdominal distension (gaseous): Secondary | ICD-10-CM | POA: Diagnosis not present

## 2022-06-15 DIAGNOSIS — K5730 Diverticulosis of large intestine without perforation or abscess without bleeding: Secondary | ICD-10-CM | POA: Diagnosis not present

## 2022-06-15 DIAGNOSIS — K219 Gastro-esophageal reflux disease without esophagitis: Secondary | ICD-10-CM | POA: Diagnosis not present

## 2022-06-29 DIAGNOSIS — Z1272 Encounter for screening for malignant neoplasm of vagina: Secondary | ICD-10-CM | POA: Diagnosis not present

## 2022-06-29 DIAGNOSIS — Z1231 Encounter for screening mammogram for malignant neoplasm of breast: Secondary | ICD-10-CM | POA: Diagnosis not present

## 2022-06-29 DIAGNOSIS — Z13820 Encounter for screening for osteoporosis: Secondary | ICD-10-CM | POA: Diagnosis not present

## 2022-06-29 DIAGNOSIS — Z6820 Body mass index (BMI) 20.0-20.9, adult: Secondary | ICD-10-CM | POA: Diagnosis not present

## 2022-06-29 DIAGNOSIS — Z01419 Encounter for gynecological examination (general) (routine) without abnormal findings: Secondary | ICD-10-CM | POA: Diagnosis not present

## 2022-07-11 ENCOUNTER — Encounter (HOSPITAL_BASED_OUTPATIENT_CLINIC_OR_DEPARTMENT_OTHER): Payer: Self-pay | Admitting: Cardiovascular Disease

## 2022-07-11 NOTE — Telephone Encounter (Signed)
Per previous documentation was given Ativan 1mg  prior to last MRI.   Please provide recommendations.  TY!  Loel Dubonnet, NP

## 2022-07-13 ENCOUNTER — Other Ambulatory Visit (HOSPITAL_COMMUNITY): Payer: BC Managed Care – PPO

## 2022-07-25 MED ORDER — LORAZEPAM 1 MG PO TABS: 1.0000 mg | ORAL_TABLET | Freq: Three times a day (TID) | ORAL | 0 refills | Status: DC

## 2022-08-02 NOTE — Telephone Encounter (Signed)
Called [facility] pharmacy in regards to back order for lorazepam 1 mg.  Was told medication is in stock and will be filled.

## 2022-08-09 ENCOUNTER — Telehealth (HOSPITAL_BASED_OUTPATIENT_CLINIC_OR_DEPARTMENT_OTHER): Payer: Self-pay | Admitting: Cardiovascular Disease

## 2022-08-09 NOTE — Telephone Encounter (Signed)
Pt c/o medication issue:  1. Name of Medication:   LORazepam (ATIVAN) 1 MG tablet    2. How are you currently taking this medication (dosage and times per day)? Not currently taking   3. Are you having a reaction (difficulty breathing--STAT)? No   4. What is your medication issue? Patient states she went to [facility] regarding this prescription yesterday and they advised her they do not have the prescription. Advised patient of documentation from RN on 11/08 stating we spoke with the pharmacy and they advised it was in stock and they would fill it. Attempted to call the pharmacy while having the patient was on the line, but was on hold for over 8 mins unable to get through to speak with someone. Patient is also requesting upon callback that she be advised where exactly she needs to go at the hospital for the upcoming MRI this medication it for. Please advise.

## 2022-08-09 NOTE — Telephone Encounter (Signed)
Called in Lorazepam, Rx printed when previously sent Advised patient that Rx called in

## 2022-08-14 DIAGNOSIS — N959 Unspecified menopausal and perimenopausal disorder: Secondary | ICD-10-CM | POA: Diagnosis not present

## 2022-08-14 DIAGNOSIS — N760 Acute vaginitis: Secondary | ICD-10-CM | POA: Diagnosis not present

## 2022-08-15 ENCOUNTER — Telehealth (HOSPITAL_COMMUNITY): Payer: Self-pay | Admitting: Emergency Medicine

## 2022-08-15 NOTE — Telephone Encounter (Signed)
Reaching out to patient to offer assistance regarding upcoming cardiac imaging study; pt verbalizes understanding of appt date/time, parking situation and where to check in, pre-test NPO status and medications ordered, and verified current allergies; name and call back number provided for further questions should they arise Halina Asano RN Navigator Cardiac Imaging [facility] [phone] office [phone] cell 

## 2022-08-16 ENCOUNTER — Encounter (HOSPITAL_BASED_OUTPATIENT_CLINIC_OR_DEPARTMENT_OTHER): Payer: Self-pay | Admitting: Cardiovascular Disease

## 2022-08-16 ENCOUNTER — Ambulatory Visit (HOSPITAL_COMMUNITY)
Admission: RE | Admit: 2022-08-16 | Discharge: 2022-08-16 | Disposition: A | Discharge status: Home / Self Care | Payer: BC Managed Care – PPO | Source: Ambulatory Visit | Attending: Cardiovascular Disease | Admitting: Cardiovascular Disease

## 2022-08-16 ENCOUNTER — Other Ambulatory Visit (HOSPITAL_BASED_OUTPATIENT_CLINIC_OR_DEPARTMENT_OTHER): Payer: Self-pay | Admitting: Cardiovascular Disease

## 2022-08-16 DIAGNOSIS — I5032 Chronic diastolic (congestive) heart failure: Secondary | ICD-10-CM

## 2022-08-16 MED ORDER — GADOBUTROL 1 MMOL/ML IV SOLN
10.0000 mL | Freq: Once | INTRAVENOUS | Status: AC | PRN
  Administered 2022-08-16: 10 mL via INTRAVENOUS

## 2022-08-23 ENCOUNTER — Telehealth (HOSPITAL_BASED_OUTPATIENT_CLINIC_OR_DEPARTMENT_OTHER): Payer: Self-pay | Admitting: *Deleted

## 2022-08-23 ENCOUNTER — Encounter (HOSPITAL_BASED_OUTPATIENT_CLINIC_OR_DEPARTMENT_OTHER): Payer: Self-pay | Admitting: Cardiovascular Disease

## 2022-08-23 NOTE — Telephone Encounter (Signed)
Michelle Kramer  P Cv Div Dwb Triage (supporting Chilton Si, MD)49 minutes ago (1:33 PM)    Hi Dr. Duke Salvia, can u give me a summary of what my MRI results mean? Also, do i have HCM? Do i have afib?  Is my heart failure the same or has it changed? Also, i am concerned because i have 2 sons and wonder if they need any testing on their hearts? Thank you!   Above received via mychart Will forward to Dr Duke Salvia for review

## 2022-08-25 ENCOUNTER — Encounter: Payer: Self-pay | Admitting: Surgical

## 2022-08-25 ENCOUNTER — Ambulatory Visit (INDEPENDENT_AMBULATORY_CARE_PROVIDER_SITE_OTHER): Payer: Self-pay | Admitting: Surgical

## 2022-08-25 VITALS — BP 146/90 | HR 60

## 2022-08-25 DIAGNOSIS — Z719 Counseling, unspecified: Secondary | ICD-10-CM

## 2022-08-25 NOTE — Progress Notes (Signed)
Botulinum Toxin Procedure Note  Procedure: Cosmetic botulinum toxin  Pre-operative Diagnosis: Dynamic rhytides  Post-operative Diagnosis: Same  Complications:  None  Brief history: The patient desires botulinum toxin injection.  She is aware of the risks including bleeding, damage to deeper structures, asymmetry, brow ptosis, eyelid ptosis, bruising. The patient understands and wishes to proceed.  Procedure: The area was prepped with alcohol and dried with a clean gauze.  Using a clean technique the botulinum toxin was diluted with 2.5 mL of bacteriostatic saline per 100 unit vial which resulted in 4 units per 0.1 mL.  Subsequently the mixture was injected in the glabellar, lateral canthal lines, forehead area with preservation of the temporal branch to the lateral eyebrow. A total of 36 Units of botulinum toxin was used. The forehead and glabellar area was injected with care to inject intramuscular only while holding pressure on the supratrochlear vessels in each area during each injection on either side of the medial corrugators. The injection proceeded vertically superiorly to the medial 2/3 of the frontalis muscle and superior 2/3 of the lateral frontalis, again with preservation of the frontal branch.  No complications were noted. Light pressure was held for 5 minutes. She was instructed explicitly in post-operative care.  Botox LOT:  [id number] EXP:  2026/02

## 2022-09-15 NOTE — Telephone Encounter (Signed)
Released in [place] and mailed with comments highlighted

## 2022-09-28 ENCOUNTER — Ambulatory Visit: Payer: BC Managed Care – PPO | Admitting: Cardiology

## 2022-09-28 ENCOUNTER — Encounter: Payer: Self-pay | Admitting: Cardiology

## 2022-09-28 VITALS — BP 168/90 | HR 60 | Ht 64.0 in | Wt 121.0 lb

## 2022-09-28 DIAGNOSIS — R739 Hyperglycemia, unspecified: Secondary | ICD-10-CM

## 2022-09-28 DIAGNOSIS — R0609 Other forms of dyspnea: Secondary | ICD-10-CM | POA: Diagnosis not present

## 2022-09-28 DIAGNOSIS — E7800 Pure hypercholesterolemia, unspecified: Secondary | ICD-10-CM

## 2022-09-28 DIAGNOSIS — I471 Supraventricular tachycardia, unspecified: Secondary | ICD-10-CM | POA: Diagnosis not present

## 2022-09-28 DIAGNOSIS — R030 Elevated blood-pressure reading, without diagnosis of hypertension: Secondary | ICD-10-CM

## 2022-09-28 NOTE — Progress Notes (Addendum)
Primary Physician/Referring:  Maggie Schwalbe, PA-C  Patient ID: Michelle Kramer, female    DOB: [birth year], 57 y.o.   MRN: [id number]  Chief Complaint  Patient presents with   Congestive Heart Failure   New Patient (Initial Visit)   HPI:    Michelle Kramer  is a 57 y.o. Caucasian female patient with primary hypertension, mitral valve prolapse, palpitations and chronic dyspnea on exertion.  He also has history of paroxysmal brief episodes of SVT and atrial tachycardia and 9 beat NSVT by Zio patch monitoring in December 2021.  She has a diagnosis of chronic diastolic heart failure and normal coronary CTA in January 2022.  CPX had revealed blunted heart rate response during exercise and 2 minutes of SVT during recovery in July 2022.  Reviewed of note, although chronic diastolic heart failure, patient symptoms do not improve with diuretics and BNP was normal.  She now presents here to establish cardiac care, previously being followed by Dr. Skeet Latch.  Patient is presently doing well, states that since he started regular exercise program, dyspnea has remained stable.  She still lacks behind when they are hiking with her husband or children but overall no marked decrease in exercise tolerance or time.  No chest pain.  She also has occasional episodes of palpitations that are rapid that last a few seconds.  She also has occasional skipping of heartbeats as well mostly when she is laying down at rest.  This lasted few seconds as well.  She has not had any dizziness or syncope.  No chest pain.  Past Medical History:  Diagnosis Date   Anemia    Anxiety    Chronic UTI    Headache    Heart murmur    Hypertension    only with prior OCP use   Insomnia    Memory loss 09/28/2021   NSVT (nonsustained ventricular tachycardia) ([place]) 07/12/2021   Post-operative state - [facility] w/ bilat salpingectomy 05/22/2012   Premature ovarian failure    resolved 03/2011 with normal FSH/estradiol   Pure  hypercholesterolemia 09/28/2021   Past Surgical History:  Procedure Laterality Date   ABDOMINAL HYSTERECTOMY     BUNIONECTOMY     right foot   HYSTEROSCOPY  2002   LAPAROSCOPIC SUPRACERVICAL HYSTERECTOMY  05/21/2012   Procedure: LAPAROSCOPIC SUPRACERVICAL HYSTERECTOMY;  Surgeon: Claiborne Billings A. Pamala Hurry, MD;  Location: [facility];  Service: Gynecology;  Laterality: N/A;   ~Laprascopic Supracervical Hysterectomy Attempted, Converted into a Robotic Assisted Hysterectomy  Laprascopic Supracervical Hysterectomy with Morcellation Possible Robotic Assistance Book in [place] Do Not Drape Robot    Family History  Adopted: Yes  Problem Relation Age of Onset   Heart failure Mother    Hypertension Mother    Lung cancer Mother    Lupus Sister    Hypertension Maternal Grandmother    Heart disease Maternal Grandmother     Social History   Tobacco Use   Smoking status: Former    Packs/day: 0.50    Years: 6.00    Total pack years: 3.00    Types: Cigarettes    Quit date: 1994    Years since quitting: 30.0   Smokeless tobacco: Never  Substance Use Topics   Alcohol use: Yes    Alcohol/week: 4.0 standard drinks of alcohol    Types: 4 Glasses of wine per week    Comment: occasional   Marital Status: Married  ROS  Review of Systems  Cardiovascular:  Positive for dyspnea on exertion and  palpitations. Negative for chest pain and leg swelling.   Objective      09/28/2022    4:56 PM 09/28/2022    3:47 PM 08/25/2022    9:37 AM  Vitals with BMI  Height  5' 4"   Weight  121 lbs   BMI  06.30   Systolic 160 109 323  Diastolic 90 78 90  Pulse  60 60   Physical Exam Neck:     Vascular: No carotid bruit or JVD.  Cardiovascular:     Rate and Rhythm: Normal rate and regular rhythm.     Pulses: Intact distal pulses.     Heart sounds: Normal heart sounds. No murmur heard.    No gallop.  Pulmonary:     Effort: Pulmonary effort is normal.     Breath sounds: Normal breath sounds.  Abdominal:      General: Bowel sounds are normal.     Palpations: Abdomen is soft.  Musculoskeletal:     Right lower leg: No edema.     Left lower leg: No edema.    Medications and allergies   Allergies  Allergen Reactions   Codeine Nausea And Vomiting and Other (See Comments)   Hydrocodone Other (See Comments)   Oxycodone Nausea And Vomiting   Oxycodone-Acetaminophen Nausea And Vomiting    Extreme nausea & vomiting   Vicodin [Hydrocodone-Acetaminophen] Nausea And Vomiting    Extreme nausea & vomiting    Medication list   Current Outpatient Medications:    CALCIUM-VITAMIN D PO, Take 2 tablets by mouth daily., Disp: , Rfl:    Cholecalciferol (D3 5000) 125 MCG (5000 UT) capsule, Take 5,000 Units by mouth daily., Disp: , Rfl:    clonazePAM (KLONOPIN) 0.5 MG tablet, Take 0.5 mg by mouth at bedtime as needed for anxiety., Disp: , Rfl:    co-enzyme Q-10 30 MG capsule, Take 30 mg by mouth 3 (three) times daily., Disp: , Rfl:    estradiol (VIVELLE-DOT) 0.1 MG/24HR patch, Place 1 patch onto the skin 2 (two) times a week., Disp: , Rfl:    LINZESS 72 MCG capsule, Take 72 mcg by mouth as needed., Disp: , Rfl:    LYSINE PO, Take by mouth daily at 6 (six) AM., Disp: , Rfl:    metoprolol succinate (TOPROL-XL) 25 MG 24 hr tablet, TAKE 1 TABLET BY MOUTH EVERY DAY, Disp: 90 tablet, Rfl: 0   Misc Natural Products (TUMERSAID) TABS, Take by mouth daily at 6 (six) AM., Disp: , Rfl:    NON FORMULARY, Take 2,800 mg by mouth daily. D-ribose, Disp: , Rfl:    OVER THE COUNTER MEDICATION, Magnesium-Take 1 tablet by mouth daily., Disp: , Rfl:    Sodium Hyaluronate, oral, (HYALURONIC ACID) 100 MG CAPS, Take by mouth., Disp: , Rfl:    valACYclovir (VALTREX) 1000 MG tablet, Take 1,000 mg by mouth as needed., Disp: , Rfl:    zolpidem (AMBIEN) 5 MG tablet, Take by mouth., Disp: , Rfl:    furosemide (LASIX) 20 MG tablet, TAKE 1 TABLET (20 MG TOTAL) BY MOUTH AS NEEDED (FOR SWELLING OR SHORTNESS OF BREATH). (Patient not taking:  Reported on 09/28/2022), Disp: 90 tablet, Rfl: 3 Laboratory examination:   Lab Results  Component Value Date   NA 138 08/02/2021   K 4.8 08/02/2021   CO2 26 08/02/2021   GLUCOSE 82 08/02/2021   BUN 11 08/02/2021   CREATININE 0.86 08/02/2021   CALCIUM 10.2 08/02/2021   EGFR 80 08/02/2021   GFRNONAA 85 (L) 02/08/2014  Lab Results  Component Value Date   GLUCOSE 82 08/02/2021   NA 138 08/02/2021   K 4.8 08/02/2021   CL 100 08/02/2021   CO2 26 08/02/2021   BUN 11 08/02/2021   CREATININE 0.86 08/02/2021   EGFR 80 08/02/2021   CALCIUM 10.2 08/02/2021   PROT 7.1 02/26/2014   ALBUMIN 4.3 02/26/2014   BILITOT 0.6 02/26/2014   ALKPHOS 47 02/26/2014   AST 13 02/26/2014   ALT 14 02/26/2014      Lab Results  Component Value Date   ALT 14 02/26/2014   AST 13 02/26/2014   ALKPHOS 47 02/26/2014   BILITOT 0.6 02/26/2014       Latest Ref Rng & Units 02/26/2014    5:11 PM 02/08/2014   10:30 AM 05/22/2012    8:28 AM  Hepatic Function  Total Protein 6.0 - 8.3 g/dL 7.1  7.0  5.7   Albumin 3.5 - 5.2 g/dL 4.3  4.1  3.4   AST 0 - 37 U/L _0 ALT 0 - 35 U/L _1 Alk Phosphatase 39 - 117 U/L 47  51  43   Total Bilirubin 0.2 - 1.2 mg/dL 0.6  0.7  0.6     External labs:   BNP 07/12/2021: BNP 0.0 - 100.0 pg/mL 69.3   Labs 05/24/2021:  Total cholesterol: 222, triglycerides 114, HDL 88, LDL 114.  Non-HDL cholesterol 134.  A1c 5.9%.  Labs 03/30/2022  BUN 16, creatinine 0.98, EGFR 69 mL.  TSH normal at 1.68.  Sedimentation rate normal at <1.  CRP <5.  Vitamin D 67.  Lipid profile 06/30/2017:  Total cholesterol 218, triglycerides 70, HDL 85, LDL 121.  Non-HDL cholesterol 123.   Radiology:    Cardiac Studies:   Echo 10/01/2020 1. Left ventricular ejection fraction, by estimation, is 55 to 60%. The left ventricle has normal function. The left ventricle has no regional wall motion abnormalities. Left ventricular diastolic parameters are consistent with Grade II  diastolic dysfunction (pseudonormalization).   2. Right ventricular systolic function is normal. The right ventricular size is normal. There is normal pulmonary artery systolic pressure.   3. Mitral valve prolapse not present. The mitral valve is normal in  structure. Trivial mitral valve regurgitation. No evidence of mitral stenosis.   4. The aortic valve is tricuspid. Aortic valve regurgitation is not visualized. No aortic stenosis is present.   5. The inferior vena cava is dilated in size with <50% respiratory  variability, suggesting right atrial pressure of 15 mmHg.  Exercise Test 04/05/2021 Conclusion: Exercise testing with gas exchange demonstrates normal functional capacity when compared to matched sedentary norms. There is no indication for cardiopulmonary abnormality. However, there was blunted heart rate response to exercise and a brief run of SVT that initiated and resolved during active recovery that could be contributing to patients exertional symptoms.    CT Coronary 06/14/2021 1. Coronary calcium score of 0. This was 0 percentile for age and sex matched control. 2. Normal coronary origin. 3. No evidence of CAD. 4. The timing of the contrast bolus is poor. Unable to fully visualize distal vessels. Given that there are no coronary calcifications, it is unlikely that there are any significant stenoses. However, given that the mid to distal arteries are not well-opacified, cannot fully rule out coronary artery disease.  5.  Consider non-cardiac causes of chest pain. 6. No significant incidental noncardiac findings are noted.   Cardiac MRI 08/12/21:  1.  Normal LV size and function EF 55% no LVH, No RWMAls 2.  Normal RV size and function RVEF 56% no evidence of RV dysplasia 3.  Normal AV,TV,PV mild thickening of MV with no prolapse 4.  Normal ascending thoracic aorta 3.1 cm 5.  No delayed gadolinium uptake on inversion recovery sequences with good myocardial nulling 6.  Increased  T1, ECV and T2 in absence of any morphologic abnormalities indeterminate sign Suggest f/u MRI in a year 7. Anterior mitral valve billowing without frank prolapse. Slight thickening. There is mild regurgitation. Regurgitant fraction 11%.  EKG:   EKG 09/28/2022: Normal sinus rhythm/sinus bradycardia at rate of 55 bpm, normal EKG.    Assessment     ICD-10-CM   1. Dyspnea on exertion  R06.09 EKG 12-Lead    2. PSVT (paroxysmal supraventricular tachycardia)  I47.10     3. Elevated BP without diagnosis of hypertension  R03.0     4. Hyperglycemia  R73.9     5. Mild hypercholesterolemia  E78.00        Orders Placed This Encounter  Procedures   EKG 12-Lead    No orders of the defined types were placed in this encounter.   There are no discontinued medications.   Recommendations:   Brandie Denasia Venn is a 57 y.o.  Caucasian female patient with  Diagnosis of hypertension while on birth control pills, palpitations and chronic dyspnea on exertion ongoing for the past 2 years.  She also has history of paroxysmal brief episodes of SVT and atrial tachycardia and 9 beat NSVT by Zio patch monitoring in December 2021.  She has a diagnosis of chronic diastolic heart failure and normal coronary CTA in January 2022.  CPX had revealed blunted heart rate response during exercise and 2 minutes of SVT during recovery in July 2022.  She now presents here to establish cardiac care, previously being followed by Dr. Skeet Latch.  1. Dyspnea on exertion Her medical records were extensively reviewed.  Patient has been having mild dyspnea on exertion but overall stable over the past 2 years and has noticed improvement since she started regular exercise program.  Extensive review of her labs as well, BMP was normal, and as per the records from Dr. Skeet Latch, Lasix did not change or improve her symptoms all suggesting that she does not have congestive heart failure/diastolic heart failure.  I have  deleted the diagnosis from her chart.  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  Her blood pressure was elevated today in our office and also previously and noticed that her diastolic blood pressure was 90 mmHg on 08/25/2022 and today being 09/28/2022.  I will hold off on starting any antihypertensive medications for now, she will continue with metoprolol for her palpitations which is at the low-dose.  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  2. PSVT (paroxysmal supraventricular tachycardia) She has brief episodes of rapid heartbeats suggestive of PSVT and also has occasional episodes of palpitations suggestive of PVCs and PACs.  Episodes are very short lasting and she is not too much bothered by this.  Unless they are sustained, watchful waiting is indicated.  3. Elevated BP without diagnosis of hypertension As indicated above in the discussion of dyspnea on exertion,  will continue to monitor his and probably have a low threshold to start therapy.  4. Hyperglycemia Again extensive review of her prior labs reveal A1c at 5.8 in 2020 and needs repeat testing.  If her OB/GYN has not performed this I will place lab orders.  5. Mild hypercholesterolemia She also needs follow-up lipid profile testing as well.  In view of 0 coronary calcium score, would only treat her lipids if they are markedly elevated.  She has no family history of sudden cardiac death or premature coronary artery disease.  With regard to use of estrogen for postmenopausal state, I do not have any contraindication from cardiac standpoint.  This was a 50-minute office visit encounter.  Number: I reviewed her labs from 05/24/2021, although LDL is >100, her HDL is 88.  Non-HDL cholesterol very close to  baseline and does not need therapy for lipids.   Adrian Prows, MD, [facility] 09/28/2022, 5:01 PM Office: [phone]

## 2022-10-02 DIAGNOSIS — Z23 Encounter for immunization: Secondary | ICD-10-CM | POA: Diagnosis not present

## 2022-10-16 ENCOUNTER — Other Ambulatory Visit: Payer: Self-pay | Admitting: Cardiology

## 2022-10-16 DIAGNOSIS — I471 Supraventricular tachycardia, unspecified: Secondary | ICD-10-CM

## 2022-10-16 MED ORDER — METOPROLOL SUCCINATE ER 25 MG PO TB24: 25.0000 mg | ORAL_TABLET | Freq: Every day | ORAL | 3 refills | Status: DC

## 2022-12-06 DIAGNOSIS — Z0000 Encounter for general adult medical examination without abnormal findings: Secondary | ICD-10-CM | POA: Diagnosis not present

## 2022-12-06 DIAGNOSIS — Z23 Encounter for immunization: Secondary | ICD-10-CM | POA: Diagnosis not present

## 2022-12-08 DIAGNOSIS — Z13220 Encounter for screening for lipoid disorders: Secondary | ICD-10-CM | POA: Diagnosis not present

## 2022-12-08 DIAGNOSIS — Z0000 Encounter for general adult medical examination without abnormal findings: Secondary | ICD-10-CM | POA: Diagnosis not present

## 2023-01-02 ENCOUNTER — Encounter: Payer: Self-pay | Admitting: Cardiology

## 2023-01-02 ENCOUNTER — Ambulatory Visit: Payer: BC Managed Care – PPO | Admitting: Cardiology

## 2023-01-02 VITALS — BP 133/77 | HR 61 | Resp 16 | Ht 64.0 in | Wt 122.0 lb

## 2023-01-02 DIAGNOSIS — R030 Elevated blood-pressure reading, without diagnosis of hypertension: Secondary | ICD-10-CM | POA: Diagnosis not present

## 2023-01-02 DIAGNOSIS — I471 Supraventricular tachycardia, unspecified: Secondary | ICD-10-CM

## 2023-01-02 NOTE — Progress Notes (Signed)
Primary Physician/Referring:  Leane Call, PA-C  Patient ID: Michelle Kramer, female    DOB: [birth year], 57 y.o.   MRN: [id number]  Chief Complaint  Patient presents with   Shortness of Breath   Follow-up    3 month   HPI:    Michelle Kramer  is a 57 y.o. Caucasian female patient with primary hypertension, mitral valve prolapse, palpitations and chronic dyspnea on exertion.  He also has history of paroxysmal brief episodes of SVT and atrial tachycardia and 9 beat NSVT by Zio patch monitoring in December 2021.  She has a diagnosis of chronic diastolic heart failure and normal coronary CTA in January 2022.  CPX had revealed blunted heart rate response during exercise and 2 minutes of SVT during recovery in July 2022.  Reviewed of note, although chronic diastolic heart failure, patient symptoms do not improve with diuretics and BNP was normal.  She now presents here to establish cardiac care, previously being followed by Dr. Chilton Si.  Patient is presently doing well, states that since he started regular exercise program, dyspnea has remained stable.  She still lacks behind when they are hiking with her husband or children but overall no marked decrease in exercise tolerance or time.  No chest pain.  She also has occasional episodes of palpitations that are rapid that last a few seconds.  She also has occasional skipping of heartbeats as well mostly when she is laying down at rest.  This lasted few seconds as well.  She has not had any dizziness or syncope.  No chest pain.  Past Medical History:  Diagnosis Date   Anemia    Anxiety    Chronic UTI    Headache    Heart murmur    Hypertension    only with prior OCP use   Insomnia    Memory loss 09/28/2021   NSVT (nonsustained ventricular tachycardia) (HCC) 07/12/2021   Post-operative state - [facility] w/ bilat salpingectomy 05/22/2012   Premature ovarian failure    resolved 03/2011 with normal FSH/estradiol   Pure  hypercholesterolemia 09/28/2021   Past Surgical History:  Procedure Laterality Date   ABDOMINAL HYSTERECTOMY     BUNIONECTOMY     right foot   HYSTEROSCOPY  2002   LAPAROSCOPIC SUPRACERVICAL HYSTERECTOMY  05/21/2012   Procedure: LAPAROSCOPIC SUPRACERVICAL HYSTERECTOMY;  Surgeon: Tresa Endo A. Ernestina Penna, MD;  Location: [facility];  Service: Gynecology;  Laterality: N/A;   ~Laprascopic Supracervical Hysterectomy Attempted, Converted into a Robotic Assisted Hysterectomy  Laprascopic Supracervical Hysterectomy with Morcellation Possible Robotic Assistance Book in Robot Room Do Not Drape Robot    Family History  Adopted: Yes  Problem Relation Age of Onset   Heart failure Mother    Hypertension Mother    Lung cancer Mother    Lupus Sister    Hypertension Maternal Grandmother    Heart disease Maternal Grandmother     Social History   Tobacco Use   Smoking status: Former    Packs/day: 0.50    Years: 6.00    Additional pack years: 0.00    Total pack years: 3.00    Types: Cigarettes    Quit date: 1994    Years since quitting: 30.2   Smokeless tobacco: Never  Substance Use Topics   Alcohol use: Yes    Alcohol/week: 4.0 standard drinks of alcohol    Types: 4 Glasses of wine per week    Comment: occasional   Marital Status: Married  ROS  Review of Systems  Cardiovascular:  Positive for dyspnea on exertion and palpitations. Negative for chest pain and leg swelling.   Objective      09/28/2022    4:56 PM 09/28/2022    3:47 PM 08/25/2022    9:37 AM  Vitals with BMI  Height  5\' 4"    Weight  121 lbs   BMI  20.76   Systolic 168 [id number]  Diastolic 90 78 90  Pulse  60 60   Physical Exam Neck:     Vascular: No carotid bruit or JVD.  Cardiovascular:     Rate and Rhythm: Normal rate and regular rhythm.     Pulses: Intact distal pulses.     Heart sounds: Normal heart sounds. No murmur heard.    No gallop.  Pulmonary:     Effort: Pulmonary effort is normal.     Breath sounds: Normal  breath sounds.  Abdominal:     General: Bowel sounds are normal.     Palpations: Abdomen is soft.  Musculoskeletal:     Right lower leg: No edema.     Left lower leg: No edema.   Medications and allergies   Allergies  Allergen Reactions   Codeine Nausea And Vomiting and Other (See Comments)   Hydrocodone Other (See Comments)   Oxycodone Nausea And Vomiting   Oxycodone-Acetaminophen Nausea And Vomiting    Extreme nausea & vomiting   Vicodin [Hydrocodone-Acetaminophen] Nausea And Vomiting    Extreme nausea & vomiting    Medication list   Current Outpatient Medications:    CALCIUM-VITAMIN D PO, Take 2 tablets by mouth daily., Disp: , Rfl:    Cholecalciferol (D3 5000) 125 MCG (5000 UT) capsule, Take 5,000 Units by mouth daily., Disp: , Rfl:    clonazePAM (KLONOPIN) 0.5 MG tablet, Take 0.5 mg by mouth at bedtime as needed for anxiety., Disp: , Rfl:    co-enzyme Q-10 30 MG capsule, Take 30 mg by mouth 3 (three) times daily., Disp: , Rfl:    estradiol (VIVELLE-DOT) 0.1 MG/24HR patch, Place 1 patch onto the skin 2 (two) times a week., Disp: , Rfl:    furosemide (LASIX) 20 MG tablet, TAKE 1 TABLET (20 MG TOTAL) BY MOUTH AS NEEDED (FOR SWELLING OR SHORTNESS OF BREATH). (Patient not taking: Reported on 09/28/2022), Disp: 90 tablet, Rfl: 3   LINZESS 72 MCG capsule, Take 72 mcg by mouth as needed., Disp: , Rfl:    LYSINE PO, Take by mouth daily at 6 (six) AM., Disp: , Rfl:    metoprolol succinate (TOPROL-XL) 25 MG 24 hr tablet, Take 1 tablet (25 mg total) by mouth daily., Disp: 90 tablet, Rfl: 3   Misc Natural Products (TUMERSAID) TABS, Take by mouth daily at 6 (six) AM., Disp: , Rfl:    NON FORMULARY, Take 2,800 mg by mouth daily. D-ribose, Disp: , Rfl:    OVER THE COUNTER MEDICATION, Magnesium-Take 1 tablet by mouth daily., Disp: , Rfl:    Sodium Hyaluronate, oral, (HYALURONIC ACID) 100 MG CAPS, Take by mouth., Disp: , Rfl:    valACYclovir (VALTREX) 1000 MG tablet, Take 1,000 mg by mouth as  needed., Disp: , Rfl:    zolpidem (AMBIEN) 5 MG tablet, Take by mouth., Disp: , Rfl:  Laboratory examination:   Lab Results  Component Value Date   NA 138 08/02/2021   K 4.8 08/02/2021   CO2 26 08/02/2021   GLUCOSE 82 08/02/2021   BUN 11 08/02/2021   CREATININE 0.86 08/02/2021   CALCIUM 10.2 08/02/2021  EGFR 80 08/02/2021   GFRNONAA 85 (L) 02/08/2014    Lab Results  Component Value Date   GLUCOSE 82 08/02/2021   NA 138 08/02/2021   K 4.8 08/02/2021   CL 100 08/02/2021   CO2 26 08/02/2021   BUN 11 08/02/2021   CREATININE 0.86 08/02/2021   EGFR 80 08/02/2021   CALCIUM 10.2 08/02/2021   PROT 7.1 02/26/2014   ALBUMIN 4.3 02/26/2014   BILITOT 0.6 02/26/2014   ALKPHOS 47 02/26/2014   AST 13 02/26/2014   ALT 14 02/26/2014      Lab Results  Component Value Date   ALT 14 02/26/2014   AST 13 02/26/2014   ALKPHOS 47 02/26/2014   BILITOT 0.6 02/26/2014       Latest Ref Rng & Units 02/26/2014    5:11 PM 02/08/2014   10:30 AM 05/22/2012    8:28 AM  Hepatic Function  Total Protein 6.0 - 8.3 g/dL 7.1  7.0  5.7   Albumin 3.5 - 5.2 g/dL 4.3  4.1  3.4   AST 0 - 37 U/L 13  26  18    ALT 0 - 35 U/L 14  22  8    Alk Phosphatase 39 - 117 U/L 47  51  43   Total Bilirubin 0.2 - 1.2 mg/dL 0.6  0.7  0.6     External labs:   BNP 07/12/2021: BNP 0.0 - 100.0 pg/mL 69.3   Labs 05/24/2021:  Total cholesterol: 222, triglycerides 114, HDL 88, LDL 114.  Non-HDL cholesterol 134.  A1c 5.9%.  Labs 03/30/2022  BUN 16, creatinine 0.98, EGFR 69 mL.  TSH normal at 1.68.  Sedimentation rate normal at <1.  CRP <5.  Vitamin D 67.  Lipid profile 06/30/2017:  Total cholesterol 218, triglycerides 70, HDL 85, LDL 121.  Non-HDL cholesterol 123.   Radiology:    Cardiac Studies:   Echo 10/01/2020 1. Left ventricular ejection fraction, by estimation, is 55 to 60%. The left ventricle has normal function. The left ventricle has no regional wall motion abnormalities. Left ventricular diastolic  parameters are consistent with Grade II diastolic dysfunction (pseudonormalization).   2. Right ventricular systolic function is normal. The right ventricular size is normal. There is normal pulmonary artery systolic pressure.   3. Mitral valve prolapse not present. The mitral valve is normal in  structure. Trivial mitral valve regurgitation. No evidence of mitral stenosis.   4. The aortic valve is tricuspid. Aortic valve regurgitation is not visualized. No aortic stenosis is present.   5. The inferior vena cava is dilated in size with <50% respiratory variability, suggesting right atrial pressure of 15 mmHg.  Exercise Test 04/05/2021 Conclusion: Exercise testing with gas exchange demonstrates normal functional capacity when compared to matched sedentary norms. There is no indication for cardiopulmonary abnormality. However, there was blunted heart rate response to exercise and a brief run of SVT that initiated and resolved during active recovery that could be contributing to patients exertional symptoms.    CT Coronary 06/14/2021 1. Coronary calcium score of 0. This was 0 percentile for age and sex matched control. 2. Normal coronary origin. 3. No evidence of CAD. 4. The timing of the contrast bolus is poor. Unable to fully visualize distal vessels. Given that there are no coronary calcifications, it is unlikely that there are any significant stenoses. However, given that the mid to distal arteries are not well-opacified, cannot fully rule out coronary artery disease.  5.  Consider non-cardiac causes of chest pain. 6. No  significant incidental noncardiac findings are noted.   Cardiac MRI 08/12/21: 1.  Normal LV size and function EF 55% no LVH, No RWMAls 2.  Normal RV size and function RVEF 56% no evidence of RV dysplasia 3.  Normal AV,TV,PV mild thickening of MV with no prolapse 4.  Normal ascending thoracic aorta 3.1 cm 5.  No delayed gadolinium uptake on inversion recovery sequences with  good myocardial nulling 6.  Increased T1, ECV and T2 in absence of any morphologic abnormalities indeterminate sign Suggest f/u MRI in a year 7. Anterior mitral valve billowing without frank prolapse. Slight thickening. There is mild regurgitation. Regurgitant fraction 11%.  EKG:   EKG 09/28/2022: Normal sinus rhythm/sinus bradycardia at rate of 55 bpm, normal EKG.    Assessment   No diagnosis found.    No orders of the defined types were placed in this encounter.   No orders of the defined types were placed in this encounter.   There are no discontinued medications.   Recommendations:   Michelle Kramer is a 57 y.o.  Caucasian female patient with  Diagnosis of hypertension while on birth control pills, palpitations and chronic dyspnea on exertion ongoing for the past 2 years.  She also has history of paroxysmal brief episodes of SVT and atrial tachycardia and 9 beat NSVT by Zio patch monitoring in December 2021.  She has a diagnosis of chronic diastolic heart failure and normal coronary CTA in January 2022.  CPX had revealed blunted heart rate response during exercise and 2 minutes of SVT during recovery in July 2022.  She now presents here to establish cardiac care, previously being followed by Dr. Chilton Si.  1. Dyspnea on exertion Her medical records were extensively reviewed.  Patient has been having mild dyspnea on exertion but overall stable over the past 2 years and has noticed improvement since she started regular exercise program.  Extensive review of her labs as well, BMP was normal, and as per the records from Dr. Chilton Si, Lasix did not change or improve her symptoms all suggesting that she does not have congestive heart failure/diastolic heart failure.  I have deleted the diagnosis from her chart.  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  Her blood pressure was elevated today in our  office and also previously and noticed that her diastolic blood pressure was 90 mmHg on 08/25/2022 and today being 09/28/2022.  I will hold off on starting any antihypertensive medications for now, she will continue with metoprolol for her palpitations which is at the low-dose.  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  2. PSVT (paroxysmal supraventricular tachycardia) She has brief episodes of rapid heartbeats suggestive of PSVT and also has occasional episodes of palpitations suggestive of PVCs and PACs.  Episodes are very short lasting and she is not too much bothered by this.  Unless they are sustained, watchful waiting is indicated.  3. Elevated BP without diagnosis of hypertension As indicated above in the discussion of dyspnea on exertion, will continue to monitor his and probably have a low threshold to start therapy.  4. Hyperglycemia Again extensive review of her prior labs reveal A1c at 5.8 in 2020 and needs repeat testing.  If her OB/GYN has not performed this I  will place lab orders.  5. Mild hypercholesterolemia She also needs follow-up lipid profile testing as well.  In view of 0 coronary calcium score, would only treat her lipids if they are markedly elevated.  She has no family history of sudden cardiac death or premature coronary artery disease.  With regard to use of estrogen for postmenopausal state, I do not have any contraindication from cardiac standpoint.  This was a 50-minute office visit encounter.  Number: I reviewed her labs from 05/24/2021, although LDL is >100, her HDL is 88.  Non-HDL cholesterol very close to baseline and does not need therapy for lipids.   Yates Decamp, MD, [facility] 01/02/2023, 3:58 PM Office: [phone]

## 2023-01-15 ENCOUNTER — Other Ambulatory Visit: Payer: Self-pay | Admitting: Cardiology

## 2023-01-15 DIAGNOSIS — I471 Supraventricular tachycardia, unspecified: Secondary | ICD-10-CM

## 2023-01-19 ENCOUNTER — Ambulatory Visit (INDEPENDENT_AMBULATORY_CARE_PROVIDER_SITE_OTHER): Payer: Self-pay | Admitting: Plastic Surgery

## 2023-01-19 DIAGNOSIS — Z719 Counseling, unspecified: Secondary | ICD-10-CM

## 2023-01-19 NOTE — Progress Notes (Signed)
Dysport Injection Procedure Note  Procedure: Cosmetic botulinum toxin  Pre-operative Diagnosis: Dynamic rhytides and midface volume loss  Post-operative Diagnosis: Same  Complications:  None  Brief history: The patient desires botulinum toxin injection of her forehead. I discussed with the patient this proposed procedure of botulinum toxin injections, which is customized depending on the particular needs of the patient. It is performed on facial rhytids as a temporary correction. The alternatives were discussed with the patient. The risks were addressed including bleeding, scarring, infection, damage to deeper structures, asymmetry, and chronic pain, which may occur infrequently after a procedure. The individual's choice to undergo a surgical procedure is based on the comparison of risks to potential benefits. Other risks include unsatisfactory results, brow ptosis, eyelid ptosis, allergic reaction, temporary paralysis, which should go away with time, bruising, blurring disturbances and delayed healing. Botulinum toxin injections do not arrest the aging process or produce permanent tightening of the eyelid.  Operative intervention maybe necessary to maintain the results of a blepharoplasty or botulinum toxin. The patient understands and wishes to proceed. Consent was given.  Procedure: The area was prepped with alcohol and dried with a clean gauze. Using a clean technique, the botulinum toxin was diluted with 3 cc of preservative-free normal saline which was slowly injected with an 18 gauge needle in a tuberculin syringes.  A 32 gauge needles were then used to inject the botulinum toxin. This mixture allow for an aliquot of 10 units per 0.1 cc in each injection site.    Subsequently the mixture was injected in the glabellar and forehead area with preservation of the temporal branch to the lateral eyebrow as well as into each lateral canthal area beginning from the lateral orbital rim medial to the  zygomaticus major in 3 separate areas. A total of 100 Units of botulinum toxin was used. The forehead and glabellar area was injected with care to inject intramuscular only while holding pressure on the supratrochlear vessels in each area during each injection on either side of the medial corrugators. The injection proceeded vertically superiorly to the medial 2/3 of the frontalis muscle and superior 2/3 of the lateral frontalis, again with preservation of the frontal branch.  No complications were noted. Light pressure was held for 5 minutes. She was instructed explicitly in post-operative care.  Dysport LOT:  [id number]

## 2023-01-24 ENCOUNTER — Encounter: Payer: Self-pay | Admitting: *Deleted

## 2023-01-31 ENCOUNTER — Other Ambulatory Visit: Payer: Self-pay | Admitting: Surgical

## 2023-01-31 ENCOUNTER — Other Ambulatory Visit (HOSPITAL_COMMUNITY): Payer: Self-pay

## 2023-01-31 MED ORDER — LIDOCAINE 23% - TETRACAINE 7% TOPICAL OINTMENT (PLASTICIZED)
1.0000 | TOPICAL_OINTMENT | Freq: Once | CUTANEOUS | 0 refills | Status: AC
  Filled 2023-01-31: qty 60, 15d supply, fill #0

## 2023-01-31 NOTE — Progress Notes (Signed)
Rx for halo laser/numbing medication

## 2023-02-01 ENCOUNTER — Telehealth: Payer: Self-pay

## 2023-02-01 NOTE — Telephone Encounter (Signed)
LMVM Matt will be sending in a prescription of topical numbing medication at [facility].  They will call once it is ready.  Come in at 8:00, bring the lidocaine with you. Your face should be clean with no makeup. Do not apply product on your face prior.  One of the staff members will apply to face. Will sit for approximately, for the numbing to take place.

## 2023-02-09 ENCOUNTER — Other Ambulatory Visit (HOSPITAL_COMMUNITY): Payer: Self-pay

## 2023-02-20 DIAGNOSIS — Z719 Counseling, unspecified: Secondary | ICD-10-CM

## 2023-02-21 ENCOUNTER — Other Ambulatory Visit (HOSPITAL_COMMUNITY): Payer: Self-pay

## 2023-02-22 ENCOUNTER — Encounter: Payer: Self-pay | Admitting: Surgical

## 2023-02-22 ENCOUNTER — Ambulatory Visit (INDEPENDENT_AMBULATORY_CARE_PROVIDER_SITE_OTHER): Payer: Self-pay | Admitting: Surgical

## 2023-02-22 ENCOUNTER — Telehealth: Payer: Self-pay

## 2023-02-22 DIAGNOSIS — Z719 Counseling, unspecified: Secondary | ICD-10-CM

## 2023-02-22 MED ORDER — VALACYCLOVIR HCL 1 G PO TABS: 1000.0000 mg | ORAL_TABLET | Freq: Every day | ORAL | 0 refills | Status: AC

## 2023-02-22 NOTE — Telephone Encounter (Signed)
Pt left voicemail wanting to clarify after care instructions from nano peel today. She stated that when she applies the Cetaphil her face burns. She wanted to know if this was normal.   Please advise.   Pt call back # [phone].   Thanks!

## 2023-02-22 NOTE — Progress Notes (Signed)
Preoperative Dx: Hyperpigmentation, sun damage  Postoperative Dx:  same  Procedure: Nano peel laser to face and neck  Anesthesia: none  Description of Procedure:  Risks and complications were explained to the patient. Consent was confirmed and signed. Time out was called and all information was confirmed to be correct. The area  area was prepped with alcohol and wiped dry. The nano peel laser was set at 8 nanometers.  The face and neck were lasered. The patient tolerated the procedure well and there were no complications. The patient is to follow up in 3 weeks via telephone to discuss results.

## 2023-02-23 ENCOUNTER — Encounter: Payer: Self-pay | Admitting: Surgical

## 2023-02-26 NOTE — Telephone Encounter (Signed)
Patient is a very pleasant 57 year old female who underwent nano peel last week on Thursday.  She called the on-call service over the weekend and reported some itching and possible hives to her neck.  I was notified today about patient's call, called her this afternoon about 230 to discuss.  She sent a picture via MyChart for me to evaluate.  The are does not appear infected based on pictures, but she does appear to have some erythematous lesions on her neck that appear to have a central crusting from the EMR images.  Based on the pictures I do not appreciate any signs of infection.  We discussed keeping the area covered with Vaseline or Aquaphor, which ever she tolerates better.  We discussed use of 1% hydrocortisone for the itching and Allegra or Zyrtec during the day and Benadryl at night to help with itching.  She can also do cool compresses.  Discussed with patient I would call her tomorrow to follow-up, she knows to call with any further questions or concerns.  Patient was appreciative.

## 2023-03-14 ENCOUNTER — Other Ambulatory Visit: Payer: Self-pay | Admitting: Cardiology

## 2023-03-14 DIAGNOSIS — R739 Hyperglycemia, unspecified: Secondary | ICD-10-CM

## 2023-03-14 DIAGNOSIS — I10 Essential (primary) hypertension: Secondary | ICD-10-CM

## 2023-03-14 MED ORDER — LISINOPRIL 20 MG PO TABS: 20.0000 mg | ORAL_TABLET | Freq: Every evening | ORAL | 1 refills | Status: DC

## 2023-03-14 NOTE — Progress Notes (Signed)
ICD-10-CM   1. Primary hypertension  I10 lisinopril (ZESTRIL) 20 MG tablet    Basic metabolic panel    Basic metabolic panel    2. Hyperglycemia  R73.9      Orders Placed This Encounter  Procedures   Basic metabolic panel    Standing Status:   Future    Number of Occurrences:   1    Standing Expiration Date:   03/13/2024    Meds ordered this encounter  Medications   lisinopril (ZESTRIL) 20 MG tablet    Sig: Take 1 tablet (20 mg total) by mouth every evening.    Dispense:  90 tablet    Refill:  1

## 2023-03-16 ENCOUNTER — Telehealth: Payer: BC Managed Care – PPO | Admitting: Surgical

## 2023-03-23 DIAGNOSIS — E559 Vitamin D deficiency, unspecified: Secondary | ICD-10-CM | POA: Diagnosis not present

## 2023-03-23 DIAGNOSIS — R5382 Chronic fatigue, unspecified: Secondary | ICD-10-CM | POA: Diagnosis not present

## 2023-03-23 DIAGNOSIS — N951 Menopausal and female climacteric states: Secondary | ICD-10-CM | POA: Diagnosis not present

## 2023-03-23 DIAGNOSIS — G4700 Insomnia, unspecified: Secondary | ICD-10-CM | POA: Diagnosis not present

## 2023-03-23 DIAGNOSIS — E538 Deficiency of other specified B group vitamins: Secondary | ICD-10-CM | POA: Diagnosis not present

## 2023-03-23 DIAGNOSIS — E039 Hypothyroidism, unspecified: Secondary | ICD-10-CM | POA: Diagnosis not present

## 2023-03-23 DIAGNOSIS — I10 Essential (primary) hypertension: Secondary | ICD-10-CM | POA: Diagnosis not present

## 2023-03-23 DIAGNOSIS — Z131 Encounter for screening for diabetes mellitus: Secondary | ICD-10-CM | POA: Diagnosis not present

## 2023-03-23 DIAGNOSIS — K581 Irritable bowel syndrome with constipation: Secondary | ICD-10-CM | POA: Diagnosis not present

## 2023-04-18 ENCOUNTER — Other Ambulatory Visit: Payer: Self-pay

## 2023-04-18 DIAGNOSIS — I471 Supraventricular tachycardia, unspecified: Secondary | ICD-10-CM

## 2023-04-18 MED ORDER — METOPROLOL SUCCINATE ER 25 MG PO TB24: 12.5000 mg | ORAL_TABLET | Freq: Every day | ORAL | 3 refills | Status: DC

## 2023-05-11 DIAGNOSIS — R5382 Chronic fatigue, unspecified: Secondary | ICD-10-CM | POA: Diagnosis not present

## 2023-05-11 DIAGNOSIS — N951 Menopausal and female climacteric states: Secondary | ICD-10-CM | POA: Diagnosis not present

## 2023-05-11 DIAGNOSIS — G4700 Insomnia, unspecified: Secondary | ICD-10-CM | POA: Diagnosis not present

## 2023-05-11 DIAGNOSIS — E039 Hypothyroidism, unspecified: Secondary | ICD-10-CM | POA: Diagnosis not present

## 2023-05-22 ENCOUNTER — Ambulatory Visit (INDEPENDENT_AMBULATORY_CARE_PROVIDER_SITE_OTHER): Payer: Self-pay | Admitting: Surgical

## 2023-05-22 DIAGNOSIS — Z719 Counseling, unspecified: Secondary | ICD-10-CM

## 2023-05-22 NOTE — Progress Notes (Signed)
Botulinum Toxin Procedure Note  Procedure: Cosmetic botulinum toxin  Pre-operative Diagnosis: Dynamic rhytides  Post-operative Diagnosis: Same  Complications:  None  Brief history: The patient desires botulinum toxin injection.  She is aware of the risks including bleeding, damage to deeper structures, asymmetry, brow ptosis, eyelid ptosis, bruising. The patient understands and wishes to proceed.  Procedure: The area was prepped with alcohol and dried with a clean gauze.  Using a clean technique the botulinum toxin was diluted with 2.5 mL of bacteriostatic saline per 100 unit vial which resulted in 4 units per 0.1 mL.  Subsequently the mixture was injected in the glabellar, lateral canthal lines, forehead area with preservation of the temporal branch to the lateral eyebrow. A total of 34 Units of botulinum toxin was used. The forehead and glabellar area was injected with care to inject intramuscular only while holding pressure on the supratrochlear vessels in each area during each injection on either side of the medial corrugators. The injection proceeded vertically superiorly to the medial 2/3 of the frontalis muscle and superior 2/3 of the lateral frontalis, again with preservation of the frontal branch.  No complications were noted. Light pressure was held for 5 minutes. She was instructed explicitly in post-operative care.  Botox LOT:  [id number] EXP:  2026/09

## 2023-05-25 NOTE — Addendum Note (Signed)
Addended by: Sherol Dade on: 05/25/2023 05:10 PM   Modules accepted: Orders

## 2023-07-19 DIAGNOSIS — Z681 Body mass index (BMI) 19 or less, adult: Secondary | ICD-10-CM | POA: Diagnosis not present

## 2023-07-19 DIAGNOSIS — Z1231 Encounter for screening mammogram for malignant neoplasm of breast: Secondary | ICD-10-CM | POA: Diagnosis not present

## 2023-07-19 DIAGNOSIS — Z01419 Encounter for gynecological examination (general) (routine) without abnormal findings: Secondary | ICD-10-CM | POA: Diagnosis not present

## 2023-10-11 ENCOUNTER — Ambulatory Visit (INDEPENDENT_AMBULATORY_CARE_PROVIDER_SITE_OTHER): Payer: Self-pay | Admitting: Surgical

## 2023-10-11 DIAGNOSIS — Z719 Counseling, unspecified: Secondary | ICD-10-CM

## 2023-10-11 NOTE — Progress Notes (Signed)
Botulinum Toxin Procedure Note  Procedure: Cosmetic botulinum toxin  Pre-operative Diagnosis: Dynamic rhytides  Post-operative Diagnosis: Same  Complications:  None  Brief history: The patient desires botulinum toxin injection.  She is aware of the risks including bleeding, damage to deeper structures, asymmetry, brow ptosis, eyelid ptosis, bruising.  Patient does report that she is bothered by the hooding of her eyelids/brows, she is interested in options for improving this with Botox.  We discussed injecting the lateral brow with 1 unit each to assist with relaxing the orbicularis oculi and raising the lateral brow.  We discussed that this could result in changing the shape of her brow.  She was comfortable with this and was willing to proceed.  We discussed that asymmetry was possible.  The patient understands and wishes to proceed.  Procedure: The area was prepped with alcohol and dried with a clean gauze.  Using a clean technique the botulinum toxin was diluted with 2.5 mL of bacteriostatic saline per 100 unit vial which resulted in 4 units per 0.1 mL.  Subsequently the mixture was injected in the glabellar, lateral canthal lines, forehead area with preservation of the temporal branch to the lateral eyebrow. A total of 38 Units of botulinum toxin was used. The forehead and glabellar area was injected with care to inject intramuscular only while holding pressure on the supratrochlear vessels in each area during each injection on either side of the medial corrugators. The injection proceeded vertically superiorly to the medial 2/3 of the frontalis muscle and superior 2/3 of the lateral frontalis, again with preservation of the frontal branch.  The forehead was injected with a standard M pattern with additional injection at the upper central forehead.  The lateral canthal lines were injected in a standard pattern with 2 injections on each side.  The glabellar was injected in a standard  pattern.  The lateral eyebrow was also injected to assist with lifting the lateral brow.  1 unit was injected in each injection site.  1 injection site per side.  No complications were noted. Light pressure was held for 5 minutes. She was instructed explicitly in post-operative care.  Botox LOT:  [id number] EXP:  2026/10

## 2023-10-25 DIAGNOSIS — K5900 Constipation, unspecified: Secondary | ICD-10-CM | POA: Diagnosis not present

## 2023-10-30 ENCOUNTER — Telehealth: Payer: Self-pay

## 2023-10-30 NOTE — Telephone Encounter (Signed)
Pt called in inquiring about BBL for her son who has acne. I informed her we do offer laser for acne but that her son would need to set up a consultation for more specific information.

## 2023-11-29 DIAGNOSIS — K5900 Constipation, unspecified: Secondary | ICD-10-CM | POA: Diagnosis not present

## 2023-12-13 DIAGNOSIS — Z0000 Encounter for general adult medical examination without abnormal findings: Secondary | ICD-10-CM | POA: Diagnosis not present

## 2023-12-13 DIAGNOSIS — R4189 Other symptoms and signs involving cognitive functions and awareness: Secondary | ICD-10-CM | POA: Diagnosis not present

## 2023-12-13 DIAGNOSIS — E7800 Pure hypercholesterolemia, unspecified: Secondary | ICD-10-CM | POA: Diagnosis not present

## 2023-12-13 DIAGNOSIS — R413 Other amnesia: Secondary | ICD-10-CM | POA: Diagnosis not present

## 2023-12-27 DIAGNOSIS — G4700 Insomnia, unspecified: Secondary | ICD-10-CM | POA: Diagnosis not present

## 2023-12-27 DIAGNOSIS — E039 Hypothyroidism, unspecified: Secondary | ICD-10-CM | POA: Diagnosis not present

## 2023-12-27 DIAGNOSIS — N951 Menopausal and female climacteric states: Secondary | ICD-10-CM | POA: Diagnosis not present

## 2023-12-27 DIAGNOSIS — R5382 Chronic fatigue, unspecified: Secondary | ICD-10-CM | POA: Diagnosis not present

## 2023-12-31 ENCOUNTER — Encounter: Payer: Self-pay | Admitting: Physician Assistant

## 2024-01-10 DIAGNOSIS — H5319 Other subjective visual disturbances: Secondary | ICD-10-CM | POA: Diagnosis not present

## 2024-01-10 DIAGNOSIS — H25813 Combined forms of age-related cataract, bilateral: Secondary | ICD-10-CM | POA: Diagnosis not present

## 2024-01-16 DIAGNOSIS — N760 Acute vaginitis: Secondary | ICD-10-CM | POA: Diagnosis not present

## 2024-01-16 DIAGNOSIS — L723 Sebaceous cyst: Secondary | ICD-10-CM | POA: Diagnosis not present

## 2024-01-16 DIAGNOSIS — N898 Other specified noninflammatory disorders of vagina: Secondary | ICD-10-CM | POA: Diagnosis not present

## 2024-01-30 ENCOUNTER — Ambulatory Visit

## 2024-01-30 ENCOUNTER — Ambulatory Visit: Admitting: Physician Assistant

## 2024-02-07 ENCOUNTER — Ambulatory Visit: Admitting: Physician Assistant

## 2024-02-07 ENCOUNTER — Ambulatory Visit

## 2024-02-07 ENCOUNTER — Encounter: Payer: Self-pay | Admitting: Physician Assistant

## 2024-02-07 VITALS — HR 61 | Resp 20 | Wt 120.0 lb

## 2024-02-07 DIAGNOSIS — R413 Other amnesia: Secondary | ICD-10-CM

## 2024-02-07 NOTE — Progress Notes (Addendum)
 Assessment/Plan:   Michelle Kramer is a very pleasant 58 y.o. 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 MoCA today is 27/30.. Workup is in progress.  Patient is able to participate on ADLs and continues to drive without significant difficulties.  Brain fog could be a result of sleep deprivation, stress, nutritional deficiencies , medications (i.e. Ambien ) etc.  Of note, she was noted to have a abnormal TSH with normal T4 (hyperthyroidism), will need to be further evaluated as per PCP as it can also affect memory and concentration. Of note, she had an MRI in late 2014 at [facility],(no notes are available on MyChart), reporting tiny 2-3 hyperintensities in the L frontal white matter of uncertain etiology, will order a follow up MRI.    Memory Concerns "Brain Fog"  MRI brain  with and without contrast to assess for underlying structural abnormality and assess vascular load, and to compare to prior findings. Pending on the results it may require further testing  Neurocognitive testing  to further evaluate cognitive concerns and determine other underlying cause of memory changes, including potential contribution from sleep, anxiety, attention, or situational depression  Continue to control mood as per PCP Recommend good control of cardiovascular risk factors Consider discontinuing Ambien  as it can contribute to memory changes and "brain fog". Follow hyperthyroidism with PCP Fol[place]ow up in 3 months   Subjective:   The patient is here alone  How long did patient have memory difficulties? For the last 11 years, worse over the last 4 years. She reports trouble processing information, and there are times when she cannot remember past events even if they involve her children, "I need some clues to remember" .Reports some difficulty remembering new information. She had been seen in  2015 with similar complaints, but it was felt to be due to situational stress and depression, when adapting to a blended family, having a stressful job and finding her biological mother (60 at the time) who had schizophrenia, alcoholism and was living in a group home.She is now denying any anxiety although sometimes finds herself tearful. repeats oneself?  Endorsed,by her children, worse over the last year Disoriented when walking into a room?  Patient denies except occasionally not remembering what patient came to the room for    Leaving objects in unusual places? Denies.   Wandering behavior?  denies .  Any personality changes?  Denies.   Any history of depression?: " Sometimes I feel I have a low level of depression. I become tearful sometimes. I am no longer anxious " Fatigue:reports extreme fatigue for the last 10 years. 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 Hallucinations or paranoia?  Denies   Seizures?  Denies    Any sleep changes?  Does not sleep well  for at least 25 years. Takes Ambien ." Sometimes I have vivid dreams", Denies REM behavior or sleepwalking   Sleep apnea?  Denies   Any hygiene concerns?  Denies   Independent of bathing and dressing?  Endorsed  Does the patient needs help with medications? Patient is in charge, "occasionally I may forget a dose"  Who is in charge of the finances? Patient and husband in charge    Any changes in appetite?  Denies     Patient have trouble swallowing? Denies.   Does the patient cook? Yes, denies forgetting common recipes Any kitchen accidents such as leaving  the stove on? Denies.   Any history of headaches?  Not frequently, can be months before having one.," if they do they are dull and they are there the whole day", with photophobia, takes tylenol  with good relief.  Denies vertigo, dizziness, balance issues Chronic pain ? Denies.   Ambulates with difficulty?  Denies.except for severe fatigue. She exercises at [employer], does strength and yoga  classes. She may have stiffness when waking up. Denies clonus, spasms.  Recent falls or head injuries? Denies.   Vision changes? Endorsed, has cataracts. Denies double vision, eye pain, nystagmus.   Any stroke like symptoms? "Random numbness and tingling  in the 4th and 5th finger and  4th/5th toes B for at least 10 years, not today". Des not drop stuff. In the past she reported "patches of decreased sensation in the arms", but not recently. Any tremors?   Denies.   Any anosmia?  Denies.   Any incontinence of urine? Has to get up 4 times a night for at least 5 years. During the day she is well.  Any bowel dysfunction? Constipation, on Fibercon.    Patient lives with husband and her youngest son    History of heavy alcohol intake? Denies.   History of heavy tobacco use? Denies.   Family history of dementia? Biological mother may have had alcoholism and schizophrenia . No history of AD that she is aware of, she is adopted Does patient drive? 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  Pertinent labs B12 445, A1c 5.6, CMP normal, TC 231, LDL 126, iron studies normal, TSH 0.071 with normal T4  MRI brain 10/25/12 remarkable for tiny frontal white matter hyperintensities (2 or 3)   Past Medical History:  Diagnosis Date   Anemia    Anxiety    Chronic UTI    Headache    Heart murmur    Hypertension    only with prior OCP use   Insomnia    Memory loss 09/28/2021   NSVT (nonsustained ventricular tachycardia) (HCC) 07/12/2021   Post-operative state - [facility] w/ bilat salpingectomy 05/22/2012   Premature ovarian failure    resolved 03/2011 with normal FSH/estradiol   Pure hypercholesterolemia 09/28/2021     Past Surgical History:  Procedure Laterality Date   ABDOMINAL HYSTERECTOMY     BUNIONECTOMY     right foot   HYSTEROSCOPY  2002   LAPAROSCOPIC SUPRACERVICAL HYSTERECTOMY  05/21/2012   Procedure: LAPAROSCOPIC SUPRACERVICAL HYSTERECTOMY;  Surgeon:  Loetta Ringer A. Johnston Nao, MD;  Location: [facility];  Service: Gynecology;  Laterality: N/A;   ~Laprascopic Supracervical Hysterectomy Attempted, Converted into a Robotic Assisted Hysterectomy  Laprascopic Supracervical Hysterectomy with Morcellation Possible Robotic Assistance Book in Robot Room Do Not Drape Robot      Allergies  Allergen Reactions   Codeine Nausea And Vomiting and Other (See Comments)   Hydrocodone Other (See Comments)   Oxycodone Nausea And Vomiting   Oxycodone-Acetaminophen  Nausea And Vomiting    Extreme nausea & vomiting   Vicodin [Hydrocodone-Acetaminophen ] Nausea And Vomiting    Extreme nausea & vomiting    Current Outpatient Medications  Medication Instructions   CALCIUM-VITAMIN D PO 2 tablets, Daily   clonazePAM  (KLONOPIN ) 0.5 mg, At bedtime PRN   co-enzyme Q-10 30 mg, 3 times daily   D3 5000 5,000 Units, Daily   DHEA 10 MG CAPS Take by mouth.   Linzess 72 mcg, As needed   lisinopril  (ZESTRIL ) 20 mg, Oral, Every evening   LYSINE PO  Daily   metoprolol  succinate (TOPROL -XL) 12.5 mg, Oral, Daily   Misc Natural Products (TUMERSAID) TABS Daily   NON FORMULARY 2,800 mg, Daily   NP Thyroid  45 mg, Every morning   OVER THE COUNTER MEDICATION Magnesium-Take 1 tablet by mouth daily.   progesterone (PROMETRIUM) 100 MG capsule SMARTSIG:1 Capsule(s) By Mouth Every Evening   Sodium Hyaluronate, oral, (HYALURONIC ACID) 100 MG CAPS Take by mouth.   valACYclovir  (VALTREX ) 1,000 mg, As needed   vitamin C 100 mg, Daily   zolpidem  (AMBIEN ) 5 MG tablet Take by mouth.     VITALS:   Vitals:   02/07/24 0757  Pulse: 61  Resp: 20  SpO2: 99%  Weight: 120 lb (54.4 kg)      PHYSICAL EXAM   HEENT:  Normocephalic, atraumatic. The superficial temporal arteries are without ropiness or tenderness. Cardiovascular: Regular rate and rhythm, 1/6 SM. Lungs: Clear to auscultation bilaterally. Neck: There are no carotid bruits noted bilaterally.  NEUROLOGICAL:    02/07/2024     9:00 AM  Montreal Cognitive Assessment   Visuospatial/ Executive (0/5) 4  Naming (0/3) 3  Attention: Read list of digits (0/2) 2  Attention: Read list of letters (0/1) 1  Attention: Serial 7 subtraction starting at 100 (0/3) 1  Language: Repeat phrase (0/2) 2  Language : Fluency (0/1) 1  Abstraction (0/2) 2  Delayed Recall (0/5) 5  Orientation (0/6) 6  Total 27  Adjusted Score (based on education) 27        No data to display           Orientation:  Alert and oriented to person, place and time. No aphasia or dysarthria. Fund of knowledge is appropriate. Recent memory impaired and remote memory intact.  Attention and concentration are normal.  Able to name objects and repeat phrases. Delayed recall  5/5 Cranial nerves: There is good facial symmetry. Extraocular muscles are intact and visual fields are full to confrontational testing. Speech is fluent and clear. No tongue deviation. Hearing is intact to conversational tone. Tone: Tone is good throughout. Sensation: Sensation is intact to light touch. Vibration is intact at the bilateral big toe.  Coordination: The patient has no difficulty with RAM's or FNF bilaterally. Normal finger to nose  Motor: Strength is 5/5 in the bilateral upper and lower extremities. There is no pronator drift. There are no fasciculations noted. DTR's: Deep tendon reflexes are 2/4 bilaterally un UE, 3/4 L>R  knee B otherwise  2/4 B.. Negative babinski Gait and Station: The patient is able to ambulate without difficulty The patient is able to heel toe walk. Gait is cautious and narrow. No foot drop. The patient is able to ambulate in a tandem fashion.       Thank you for allowing us  the opportunity to participate in the care of this nice patient. Please do not hesitate to contact us  for any questions or concerns.   Total time spent on today's visit was 60 minutes dedicated to this patient today, preparing to see patient, examining the patient, ordering tests  and/or medications and counseling the patient, documenting clinical information in the EHR or other health record, independently interpreting results and communicating results to the patient/family, discussing treatment and goals, answering patient's questions and coordinating care.  Cc:  Nodal, Ulysees Gander, PA-C  Tex Filbert 02/07/2024 9:32 AM

## 2024-02-07 NOTE — Patient Instructions (Signed)
 It was a pleasure to see you today at our office.   Recommendations:  Neurocognitive evaluation at our office   MRI of the brain, the radiology office will call you to arrange you appointment    Follow up in 3 months     For psychiatric meds, mood meds: Please have your primary care physician manage these medications.  If you have any severe symptoms of a stroke, or other severe issues such as confusion,severe chills or fever, etc call 911 or go to the ER as you may need to be evaluated further   For guidance regarding [facility] and if placement were needed at the facility, contact Social Worker tel: [phone]  For assessment of decision of mental capacity and competency:  Call Dr. Laverne Potter, geriatric psychiatrist at [phone]  Counseling regarding caregiver distress, including caregiver depression, anxiety and issues regarding community resources, adult day care programs, adult living facilities, or memory care questions:  please contact your  Primary Doctor's Social Worker   Whom to call: Memory  decline, memory medications: Call our office [phone]    [email or web address]   RECOMMENDATIONS FOR ALL PATIENTS WITH MEMORY PROBLEMS: 1. Continue to exercise (Recommend 30 minutes of walking everyday, or 3 hours every week) 2. Increase social interactions - continue going to [place] and enjoy social gatherings with friends and family 3. Eat healthy, avoid fried foods and eat more fruits and vegetables 4. Maintain adequate blood pressure, blood sugar, and blood cholesterol level. Reducing the risk of stroke and cardiovascular disease also helps promoting better memory. 5. Avoid stressful situations. Live a simple life and avoid aggravations. Organize your time and prepare for the next day in anticipation. 6. Sleep well, avoid any interruptions of sleep and avoid any distractions in the bedroom that may  interfere with adequate sleep quality 7. Avoid sugar, avoid sweets as there is a strong link between excessive sugar intake, diabetes, and cognitive impairment We discussed the Mediterranean diet, which has been shown to help patients reduce the risk of progressive memory disorders and reduces cardiovascular risk. This includes eating fish, eat fruits and green leafy vegetables, nuts like almonds and hazelnuts, walnuts, and also use olive oil. Avoid fast foods and fried foods as much as possible. Avoid sweets and sugar as sugar use has been linked to worsening of memory function.  There is always a concern of gradual progression of memory problems. If this is the case, then we may need to adjust level of care according to patient needs. Support, both to the patient and caregiver, should then be put into place.        DRIVING: Regarding driving, in patients with progressive memory problems, driving will be impaired. We advise to have someone else do the driving if trouble finding directions or if minor accidents are reported. Independent driving assessment is available to determine safety of driving.   If you are interested in the driving assessment, you can contact the following:  [employer] in [place] [phone]  [facility] [phone]  [facility] [phone]  [facility] [phone] or [phone]   FALL PRECAUTIONS: Be cautious when walking. Scan the area for obstacles that may increase the risk of trips and falls. When getting up in the mornings, sit up at the edge of the bed for a few minutes before getting out of bed. Consider elevating the bed at the head end to avoid drop of blood pressure when getting up. Walk always in a well-lit  room (use night lights in the walls). Avoid area rugs or power cords from appliances in the middle of the walkways. Use a walker or a cane if necessary and consider physical therapy for balance  exercise. Get your eyesight checked regularly.  FINANCIAL OVERSIGHT: Supervision, especially oversight when making financial decisions or transactions is also recommended.  HOME SAFETY: Consider the safety of the kitchen when operating appliances like stoves, microwave oven, and blender. Consider having supervision and share cooking responsibilities until no longer able to participate in those. Accidents with firearms and other hazards in the house should be identified and addressed as well.   ABILITY TO BE LEFT ALONE: If patient is unable to contact 911 operator, consider using LifeLine, or when the need is there, arrange for someone to stay with patients. Smoking is a fire hazard, consider supervision or cessation. Risk of wandering should be assessed by caregiver and if detected at any point, supervision and safe proof recommendations should be instituted.  MEDICATION SUPERVISION: Inability to self-administer medication needs to be constantly addressed. Implement a mechanism to ensure safe administration of the medications.      Mediterranean Diet A Mediterranean diet refers to food and lifestyle choices that are based on the traditions of countries located on the [place]. This way of eating has been shown to help prevent certain conditions and improve outcomes for people who have chronic diseases, like kidney disease and heart disease. What are tips for following this plan? Lifestyle  Cook and eat meals together with your family, when possible. Drink enough fluid to keep your urine clear or pale yellow. Be physically active every day. This includes: Aerobic exercise like running or swimming. Leisure activities like gardening, walking, or housework. Get 7-8 hours of sleep each night. If recommended by your health care provider, drink red wine in moderation. This means 1 glass a day for nonpregnant women and 2 glasses a day for men. A glass of wine equals 5 oz (150 mL). Reading  food labels  Check the serving size of packaged foods. For foods such as rice and pasta, the serving size refers to the amount of cooked product, not dry. Check the total fat in packaged foods. Avoid foods that have saturated fat or trans fats. Check the ingredients list for added sugars, such as corn syrup. Shopping  At the grocery store, buy most of your food from the areas near the walls of the store. This includes: Fresh fruits and vegetables (produce). Grains, beans, nuts, and seeds. Some of these may be available in unpackaged forms or large amounts (in bulk). Fresh seafood. Poultry and eggs. Low-fat dairy products. Buy whole ingredients instead of prepackaged foods. Buy fresh fruits and vegetables in-season from local farmers markets. Buy frozen fruits and vegetables in resealable bags. If you do not have access to quality fresh seafood, buy precooked frozen shrimp or canned fish, such as tuna, salmon, or sardines. Buy small amounts of raw or cooked vegetables, salads, or olives from the deli or salad bar at your store. Stock your pantry so you always have certain foods on hand, such as olive oil, canned tuna, canned tomatoes, rice, pasta, and beans. Cooking  Cook foods with extra-virgin olive oil instead of using butter or other vegetable oils. Have meat as a side dish, and have vegetables or grains as your main dish. This means having meat in small portions or adding small amounts of meat to foods like pasta or stew. Use beans or vegetables instead of meat  in common dishes like chili or lasagna. Experiment with different cooking methods. Try roasting or broiling vegetables instead of steaming or sauteing them. Add frozen vegetables to soups, stews, pasta, or rice. Add nuts or seeds for added healthy fat at each meal. You can add these to yogurt, salads, or vegetable dishes. Marinate fish or vegetables using olive oil, lemon juice, garlic, and fresh herbs. Meal planning  Plan to  eat 1 vegetarian meal one day each week. Try to work up to 2 vegetarian meals, if possible. Eat seafood 2 or more times a week. Have healthy snacks readily available, such as: Vegetable sticks with hummus. Greek yogurt. Fruit and nut trail mix. Eat balanced meals throughout the week. This includes: Fruit: 2-3 servings a day Vegetables: 4-5 servings a day Low-fat dairy: 2 servings a day Fish, poultry, or lean meat: 1 serving a day Beans and legumes: 2 or more servings a week Nuts and seeds: 1-2 servings a day Whole grains: 6-8 servings a day Extra-virgin olive oil: 3-4 servings a day Limit red meat and sweets to only a few servings a month What are my food choices? Mediterranean diet Recommended Grains: Whole-grain pasta. Brown rice. Bulgar wheat. Polenta. Couscous. Whole-wheat bread. Dwyane Glad. Vegetables: Artichokes. Beets. Broccoli. Cabbage. Carrots. Eggplant. Green beans. Chard. Kale. Spinach. Onions. Leeks. Peas. Squash. Tomatoes. Peppers. Radishes. Fruits: Apples. Apricots. Avocado. Berries. Bananas. Cherries. Dates. Figs. Grapes. Lemons. Melon. Oranges. Peaches. Plums. Pomegranate. Meats and other protein foods: Beans. Almonds. Sunflower seeds. Pine nuts. Peanuts. Cod. Salmon. Scallops. Shrimp. Tuna. Tilapia. Clams. Oysters. Eggs. Dairy: Low-fat milk. Cheese. Greek yogurt. Beverages: Water. Red wine. Herbal tea. Fats and oils: Extra virgin olive oil. Avocado oil. Grape seed oil. Sweets and desserts: [place] yogurt with honey. Baked apples. Poached pears. Trail mix. Seasoning and other foods: Basil. Cilantro. Coriander. Cumin. Mint. Parsley. Sage. Rosemary. Tarragon. Garlic. Oregano. Thyme. Pepper. Balsalmic vinegar. Tahini. Hummus. Tomato sauce. Olives. Mushrooms. Limit these Grains: Prepackaged pasta or rice dishes. Prepackaged cereal with added sugar. Vegetables: Deep fried potatoes (french fries). Fruits: Fruit canned in syrup. Meats and other protein foods: Beef. Pork.  Lamb. Poultry with skin. Hot dogs. Helene Loader. Dairy: Ice cream. Sour cream. Whole milk. Beverages: Juice. Sugar-sweetened soft drinks. Beer. Liquor and spirits. Fats and oils: Butter. Canola oil. Vegetable oil. Beef fat (tallow). Lard. Sweets and desserts: Cookies. Cakes. Pies. Candy. Seasoning and other foods: Mayonnaise. Premade sauces and marinades. The items listed may not be a complete list. Talk with your dietitian about what dietary choices are right for you. Summary The Mediterranean diet includes both food and lifestyle choices. Eat a variety of fresh fruits and vegetables, beans, nuts, seeds, and whole grains. Limit the amount of red meat and sweets that you eat. Talk with your health care provider about whether it is safe for you to drink red wine in moderation. This means 1 glass a day for nonpregnant women and 2 glasses a day for men. A glass of wine equals 5 oz (150 mL). This information is not intended to replace advice given to you by your health care provider. Make sure you discuss any questions you have with your health care provider. Document Released: 05/04/2016 Document Revised: 06/06/2016 Document Reviewed: 05/04/2016 Elsevier Interactive Patient Education  2017 [employer].

## 2024-02-11 DIAGNOSIS — E538 Deficiency of other specified B group vitamins: Secondary | ICD-10-CM | POA: Diagnosis not present

## 2024-02-11 DIAGNOSIS — G4700 Insomnia, unspecified: Secondary | ICD-10-CM | POA: Diagnosis not present

## 2024-02-11 DIAGNOSIS — N951 Menopausal and female climacteric states: Secondary | ICD-10-CM | POA: Diagnosis not present

## 2024-02-11 DIAGNOSIS — R5382 Chronic fatigue, unspecified: Secondary | ICD-10-CM | POA: Diagnosis not present

## 2024-02-11 DIAGNOSIS — E039 Hypothyroidism, unspecified: Secondary | ICD-10-CM | POA: Diagnosis not present

## 2024-02-11 DIAGNOSIS — Z131 Encounter for screening for diabetes mellitus: Secondary | ICD-10-CM | POA: Diagnosis not present

## 2024-02-11 DIAGNOSIS — H25812 Combined forms of age-related cataract, left eye: Secondary | ICD-10-CM | POA: Diagnosis not present

## 2024-02-11 DIAGNOSIS — K581 Irritable bowel syndrome with constipation: Secondary | ICD-10-CM | POA: Diagnosis not present

## 2024-02-11 DIAGNOSIS — I10 Essential (primary) hypertension: Secondary | ICD-10-CM | POA: Diagnosis not present

## 2024-02-20 ENCOUNTER — Telehealth: Payer: Self-pay

## 2024-02-20 DIAGNOSIS — E039 Hypothyroidism, unspecified: Secondary | ICD-10-CM | POA: Diagnosis not present

## 2024-02-20 DIAGNOSIS — H25812 Combined forms of age-related cataract, left eye: Secondary | ICD-10-CM | POA: Diagnosis not present

## 2024-02-20 DIAGNOSIS — I10 Essential (primary) hypertension: Secondary | ICD-10-CM | POA: Diagnosis not present

## 2024-02-20 NOTE — Telephone Encounter (Signed)
 Referral sent to [facility] they have received it, pending approval with patient. They will follow up with her. 02/20/2024 at 10:02am

## 2024-02-21 ENCOUNTER — Telehealth: Payer: Self-pay | Admitting: Cardiology

## 2024-02-21 NOTE — Telephone Encounter (Signed)
 That is fine. Happy to see her soon

## 2024-02-21 NOTE — Telephone Encounter (Signed)
 STAT if HR is under 50 or over 120 (normal HR is 60-100 beats per minute)  What is your heart rate? 57  Do you have a log of your heart rate readings (document readings)? 05/29 HR 34 while getting a cataract procedure yesterday    Do you have any other symptoms? Tired with no stamina    Patient states she was advised while getting a cataract procedure yesterday they advised her the monitor kept going off due to her HR getting down to 34. She reports she has been tired for awhile with no stamina. Rescheduled f/u for tomorrow, 05/30 at 11:20 am. Please advice.

## 2024-02-21 NOTE — Telephone Encounter (Signed)
 Spoke with pt regarding her heart rate. Pt stated that her heart rate went down to 34 while having cataract surgery. Pt stated that her heart rate is now in the high 50s. Pt stated the only symptom she is having is fatigue. Pt did not have any blood pressure readings to share. Pt was made an appointment for tomorrow 5/30 to see Dr. Berry Bristol. Pt was told the information provided would be sent to Dr. Berry Bristol and his nurse for suggestions. Pt verbalized understanding. All questions if any were answered.

## 2024-02-22 ENCOUNTER — Encounter: Payer: Self-pay | Admitting: Cardiology

## 2024-02-22 ENCOUNTER — Ambulatory Visit: Attending: Cardiology | Admitting: Cardiology

## 2024-02-22 VITALS — BP 104/72 | HR 52 | Ht 64.0 in | Wt 118.4 lb

## 2024-02-22 DIAGNOSIS — I495 Sick sinus syndrome: Secondary | ICD-10-CM | POA: Diagnosis not present

## 2024-02-22 DIAGNOSIS — I10 Essential (primary) hypertension: Secondary | ICD-10-CM | POA: Diagnosis not present

## 2024-02-22 DIAGNOSIS — I471 Supraventricular tachycardia, unspecified: Secondary | ICD-10-CM

## 2024-02-22 DIAGNOSIS — R001 Bradycardia, unspecified: Secondary | ICD-10-CM

## 2024-02-22 MED ORDER — VERAPAMIL HCL 40 MG PO TABS: 40.0000 mg | ORAL_TABLET | Freq: Two times a day (BID) | ORAL | 1 refills | Status: AC | PRN

## 2024-02-22 NOTE — Patient Instructions (Signed)
 Medication Instructions:  Stop metoprolol  Start Verapamil 40 mg by mouth twice daily as needed for palpitations *If you need a refill on your cardiac medications before your next appointment, please call your pharmacy*  Lab Work: none If you have labs (blood work) drawn today and your tests are completely normal, you will receive your results only by: MyChart Message (if you have MyChart) OR A paper copy in the mail If you have any lab test that is abnormal or we need to change your treatment, we will call you to review the results.  Testing/Procedures: none  Follow-Up: At [facility], you and your health needs are our priority.  As part of our continuing mission to provide you with exceptional heart care, our providers are all part of one team.  This team includes your primary Cardiologist (physician) and Advanced Practice Providers or APPs (Physician Assistants and Nurse Practitioners) who all work together to provide you with the care you need, when you need it.  Your next appointment:   As needed  Provider:  DR Berry Bristol    We recommend signing up for the patient portal called "MyChart".  Sign up information is provided on this After Visit Summary.  MyChart is used to connect with patients for Virtual Visits (Telemedicine).  Patients are able to view lab/test results, encounter notes, upcoming appointments, etc.  Non-urgent messages can be sent to your provider as well.   To learn more about what you can do with MyChart, go to [email or web address].   Other Instructions

## 2024-02-22 NOTE — Progress Notes (Signed)
 Cardiology Office Note:  .   Date:  02/22/2024  ID:  Michelle Kramer, DOB 1966-04-02, MRN [id number] PCP: Spero Dye, PA-C  [facility] Providers Cardiologist:  None   History of Present Illness: .   Michelle Kramer is a 58 y.o. Caucasian female patient with  Diagnosis of hypertension while on birth control pills, palpitations and chronic dyspnea on exertion ongoing for the past 2 years.  She also has history of paroxysmal brief episodes of SVT and atrial tachycardia and 9 beat NSVT by Zio patch monitoring in December 2021.    She has a diagnosis of chronic diastolic heart failure and normal coronary CTA in January 2022.  CPX had revealed blunted heart rate response during exercise and 2 minutes of SVT during recovery in July 2022. Coronary CT angiogram on 06/14/2021 revealed coronary calcium score of 0 and normal coronary arteries, cardiac MRI on 08/12/2021 was normal.    She also has occasional episodes of palpitations that are rapid that last a few seconds.  An urgent appointment was made as 3 days ago she had cataract surgery and her heart rate was noted to be very low, <40 bpm.  Discussed the use of AI scribe software for clinical note transcription with the patient, who gave verbal consent to proceed.  History of Present Illness Michelle Kramer is a 58 year old female with sinus node dysfunction who presents with bradycardia and fatigue. She experiences bradycardia with heart rates dropping to the low thirties during cataract surgery, fluctuating between 34 and the low forties. Fatigue and decreased energy levels are present.  She has sinus node dysfunction and underwent a cardiopulmonary stress test in 2022. Palpitations occur, and she is on metoprolol  25 mg. Brief episodes of supraventricular and atrial tachycardia were noted in December 2021, leading to beta blocker therapy. A coronary CT angiogram and cardiac MRI were performed in 2022 which revealed coronary calcium  score of 0 and normal coronary arteries, cardiac MRI was completely normal performed due to 9 beat run of NSVT.  She does not feel heart skipping beats but sometimes feels her heart racing and then stopping.  She is on lisinopril  for hypertension, with fluctuating blood pressure readings, and NP Thyroid  for low thyroid  function. Fatigue and breathlessness have been present since 2021, with difficulty in exercise and a slow heart rate increase on the treadmill.  Labs   External Labs:  Care everywhere labs 12/13/2023:  Total cholesterol 231, triglycerides 85, HDL 87, LDL 126.  Sodium 138, potassium 4.9, BUN 12, creatinine 0.81, EGFR 85 mL.  A1c 5.6%.  TSH markedly reduced at 0.071.  Free T4 normal at 0.7.  Hb 13.3/HCT 40.6, platelets 315.  ROS  Review of Systems  Constitutional: Positive for malaise/fatigue.  Cardiovascular:  Positive for palpitations (rare).    Physical Exam:   VS:  BP 104/72   Pulse (!) 52   Ht 5\' 4"  (1.626 m)   Wt 118 lb 6.4 oz (53.7 kg)   LMP 05/08/2012   SpO2 96%   BMI 20.32 kg/m    Wt Readings from Last 3 Encounters:  02/22/24 118 lb 6.4 oz (53.7 kg)  02/07/24 120 lb (54.4 kg)  01/02/23 122 lb (55.3 kg)    Physical Exam Neck:     Vascular: No carotid bruit or JVD.  Cardiovascular:     Rate and Rhythm: Regular rhythm. Bradycardia present.     Pulses: Intact distal pulses.     Heart sounds: Normal heart  sounds. No murmur heard.    No gallop.  Pulmonary:     Effort: Pulmonary effort is normal.     Breath sounds: Normal breath sounds.  Abdominal:     General: Bowel sounds are normal.     Palpations: Abdomen is soft.  Musculoskeletal:     Right lower leg: No edema.     Left lower leg: No edema.    Studies Reviewed: Aaron Aas     EKG:    EKG Interpretation Date/Time:  Friday Feb 22 2024 09:15:05 EDT Ventricular Rate:  52 PR Interval:  154 QRS Duration:  70 QT Interval:  468 QTC Calculation: 435 R Axis:   78  Text Interpretation: EKG  02/22/2024: Marked sinus bradycardia at rate of 52 bpm otherwise normal EKG.  Compared to 09/28/2022, heart rate was 55 bpm. Confirmed by Raevin Wierenga, Jagadeesh ([id number]) on 02/22/2024 9:23:58 AM    Medications and allergies    Allergies  Allergen Reactions   Codeine Nausea And Vomiting and Other (See Comments)   Hydrocodone Other (See Comments)   Oxycodone Nausea And Vomiting   Oxycodone-Acetaminophen  Nausea And Vomiting    Extreme nausea & vomiting   Vicodin [Hydrocodone-Acetaminophen ] Nausea And Vomiting    Extreme nausea & vomiting     Current Outpatient Medications:    CALCIUM-VITAMIN D PO, Take 2 tablets by mouth daily., Disp: , Rfl:    clonazePAM  (KLONOPIN ) 0.5 MG tablet, Take 0.5 mg by mouth at bedtime as needed for anxiety., Disp: , Rfl:    co-enzyme Q-10 30 MG capsule, Take 30 mg by mouth 3 (three) times daily., Disp: , Rfl:    estradiol (CLIMARA - DOSED IN MG/24 HR) 0.05 mg/24hr patch, Place 0.05 mg onto the skin once a week., Disp: , Rfl:    LINZESS 72 MCG capsule, Take 72 mcg by mouth as needed., Disp: , Rfl:    lisinopril  (ZESTRIL ) 10 MG tablet, Take 10 mg by mouth daily., Disp: , Rfl:    LYSINE PO, Take by mouth daily at 6 (six) AM., Disp: , Rfl:    metoprolol  succinate (TOPROL -XL) 25 MG 24 hr tablet, Take 0.5 tablets (12.5 mg total) by mouth daily., Disp: 90 tablet, Rfl: 3   Misc Natural Products (TUMERSAID) TABS, Take by mouth daily at 6 (six) AM., Disp: , Rfl:    NP THYROID  30 MG tablet, Take 45 mg by mouth every morning., Disp: , Rfl:    OVER THE COUNTER MEDICATION, Magnesium-Take 1 tablet by mouth daily., Disp: , Rfl:    Prasterone, DHEA, (DHEA PO), Take 5 mg by mouth daily., Disp: , Rfl:    progesterone (PROMETRIUM) 100 MG capsule, SMARTSIG:1 Capsule(s) By Mouth Every Evening, Disp: , Rfl:    Sodium Hyaluronate, oral, (HYALURONIC ACID) 100 MG CAPS, Take by mouth., Disp: , Rfl:    UNABLE TO FIND, Take 1 tablet by mouth daily. Med Name: Vitamin D3 + K12, Disp: , Rfl:    UNABLE  TO FIND, Take 1 tablet by mouth daily. Med Name: Wayne Haines, Disp: , Rfl:    valACYclovir  (VALTREX ) 1000 MG tablet, Take 1,000 mg by mouth as needed., Disp: , Rfl:    zolpidem  (AMBIEN ) 5 MG tablet, Take by mouth., Disp: , Rfl:    No orders of the defined types were placed in this encounter.    Medications Discontinued During This Encounter  Medication Reason   Cholecalciferol (D3 5000) 125 MCG (5000 UT) capsule    lisinopril  (ZESTRIL ) 20 MG tablet    DHEA 10  MG CAPS    Ascorbic Acid (VITAMIN C) 100 MG tablet    NON FORMULARY      ASSESSMENT AND PLAN: .      ICD-10-CM   1. Bradycardia by electrocardiogram  R00.1 EKG 12-Lead    2. Sinus node dysfunction (HCC)  I49.5     3. PSVT (paroxysmal supraventricular tachycardia) (HCC)  I47.10     4. Primary hypertension  I10       Assessment and Plan Assessment & Plan Sinus node dysfunction Sinus node dysfunction with bradycardia, heart rate in low 30s during rest. Symptoms include fatigue and reduced exercise tolerance. No current indication for pacemaker as condition is not life-limiting. Sinus node dysfunction can lead to low heart rates, especially during sleep, but is not life-threatening.  TSH very low, indicating over-supplementation, but not causing bradycardia. Heart rate may drop to 25 without causing syncope. She has had a cardiopulmonary stress test on 04/05/2021 revealing excellent exercise tolerance however blunted heart rate response to exercise and a brief run of SVT which resolved in recovery and heart rate response may be contributing to her symptoms of fatigue. Even prior to initiation of beta-blocker therapy, she has not had an appropriate and low heart rate response to exercise. - Discontinue metoprolol  to improve heart rate response.  Expect palpitations and evaluate and manage palpitations depending upon her response. - Monitor exercise tolerance and heart rate response. - Consider pacemaker if condition becomes  life-limiting. -  Paroxysmal supraventricular tachycardia (PSVT) Episodes of PSVT and atrial tachycardia, previously managed with metoprolol , now discontinued due to bradycardia. Low risk of cardiac arrhythmic events. PSVT is not life-threatening but can be bothersome. Propranolol or verapamil  can be used as needed for palpitations. - Prescribe verapamil  as needed for palpitations. - Discontinue metoprolol .  Hypertension Hypertension managed with lisinopril . Blood pressure readings fluctuate, with occasional high readings. Current dose of lisinopril  is 10 mg, deemed sufficient for now. - Continue lisinopril  10 mg daily. - Monitor blood pressure regularly. - Increase lisinopril  to 20 mg if blood pressure remains consistently high.  Patient has had cardiopulmonary stress test on 04/05/2021 revealing blunted heart rate response and brief run of SVT that could be contributing to patient's symptoms of fatigue.  However in view of episodes of PSVT and palpitations, she is also on a low-dose of a beta-blocker as well.  She may have mild form of sinus node dysfunction. I will see her back on a as needed basis.  Watchful waiting is indicated at this time.  Patient is aware to contact me if her symptoms continue to get worse.   Signed,  Knox Perl, MD, [facility] 02/22/2024, 9:38 AM [facility] [address] [place], [place] [address] Phone: [phone]. Fax:  [phone]

## 2024-02-28 DIAGNOSIS — E039 Hypothyroidism, unspecified: Secondary | ICD-10-CM | POA: Diagnosis not present

## 2024-02-28 DIAGNOSIS — N951 Menopausal and female climacteric states: Secondary | ICD-10-CM | POA: Diagnosis not present

## 2024-02-28 DIAGNOSIS — R5382 Chronic fatigue, unspecified: Secondary | ICD-10-CM | POA: Diagnosis not present

## 2024-02-28 DIAGNOSIS — G4700 Insomnia, unspecified: Secondary | ICD-10-CM | POA: Diagnosis not present

## 2024-03-03 ENCOUNTER — Other Ambulatory Visit: Payer: Self-pay | Admitting: Physician Assistant

## 2024-03-03 ENCOUNTER — Telehealth: Payer: Self-pay | Admitting: Physician Assistant

## 2024-03-03 MED ORDER — DIAZEPAM 2 MG PO TABS
ORAL_TABLET | ORAL | 0 refills | Status: AC
Start: 2024-03-03 — End: ?

## 2024-03-03 NOTE — Telephone Encounter (Signed)
 Pt called in stating she has her MRI on 03/09/24 and is claustrophobic and would like to see if she could get some anti anxiety medication to take for the MRI? Her pharmacy is [facility] on [address] [place]

## 2024-03-09 ENCOUNTER — Other Ambulatory Visit

## 2024-03-09 ENCOUNTER — Ambulatory Visit
Admission: RE | Admit: 2024-03-09 | Discharge: 2024-03-09 | Disposition: A | Discharge status: Home / Self Care | Source: Ambulatory Visit | Attending: Physician Assistant | Admitting: Physician Assistant

## 2024-03-09 DIAGNOSIS — R9082 White matter disease, unspecified: Secondary | ICD-10-CM | POA: Diagnosis not present

## 2024-03-09 DIAGNOSIS — R413 Other amnesia: Secondary | ICD-10-CM | POA: Diagnosis not present

## 2024-03-09 MED ORDER — GADOPICLENOL 0.5 MMOL/ML IV SOLN
6.0000 mL | Freq: Once | INTRAVENOUS | Status: AC | PRN
  Administered 2024-03-09: 6 mL via INTRAVENOUS

## 2024-03-12 ENCOUNTER — Other Ambulatory Visit: Payer: Self-pay | Admitting: Cardiology

## 2024-03-12 DIAGNOSIS — H25811 Combined forms of age-related cataract, right eye: Secondary | ICD-10-CM | POA: Diagnosis not present

## 2024-03-12 DIAGNOSIS — I10 Essential (primary) hypertension: Secondary | ICD-10-CM | POA: Diagnosis not present

## 2024-03-12 DIAGNOSIS — E039 Hypothyroidism, unspecified: Secondary | ICD-10-CM | POA: Diagnosis not present

## 2024-03-13 ENCOUNTER — Telehealth: Payer: Self-pay | Admitting: Physician Assistant

## 2024-03-13 DIAGNOSIS — H25811 Combined forms of age-related cataract, right eye: Secondary | ICD-10-CM | POA: Diagnosis not present

## 2024-03-13 NOTE — Telephone Encounter (Signed)
Pt would like the results of the MRI

## 2024-03-17 ENCOUNTER — Encounter: Payer: Self-pay | Admitting: Cardiology

## 2024-03-17 ENCOUNTER — Other Ambulatory Visit: Payer: Self-pay

## 2024-03-17 MED ORDER — LISINOPRIL 10 MG PO TABS: 10.0000 mg | ORAL_TABLET | Freq: Every day | ORAL | 3 refills | Status: AC

## 2024-03-24 ENCOUNTER — Ambulatory Visit: Payer: Self-pay | Admitting: Physician Assistant

## 2024-04-04 ENCOUNTER — Ambulatory Visit: Admitting: Cardiology

## 2024-04-18 ENCOUNTER — Ambulatory Visit: Admitting: Physician Assistant

## 2024-04-18 ENCOUNTER — Encounter: Payer: Self-pay | Admitting: Physician Assistant

## 2024-04-18 VITALS — BP 114/66 | HR 66 | Resp 20 | Ht 64.0 in | Wt 119.0 lb

## 2024-04-18 DIAGNOSIS — R413 Other amnesia: Secondary | ICD-10-CM

## 2024-04-18 NOTE — Patient Instructions (Signed)
 Referral to neuropsychology Repeat MRI brain Dec 2025

## 2024-04-18 NOTE — Progress Notes (Signed)
 Assessment/Plan:    Memory concerns Brain Fog  Michelle Kramer is a very pleasant 58 y.o. 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  Patient is not on antidementia medication at this time.  Latest MRI of the brain June 2025 was without acute findings, no atrophy or volume change.  5 or 6 punctate foci of T2 flair signal within the subcortical white matter, not definitely significant was seen, and another punctate foci of enhancement in the inferior right cerebellar and cerebral pons, left basal ganglia are not associated with T2 flair was seen which could represent minimal developmental venous abnormalities.  Discussed these findings with another specialist to confirm that this findings are not likely to be of pathological concern.  She is able to participate in her ADLs and to drive without significant difficulties.  Her brain fog could be the result of sleep deprivation, stress, nutritional deficiency, medication such as Ambien  and hyperthyroidism.  For diagnostic clarity, we discussed proceeding with neuropsych evaluation, and if there is no indication of memory disorder, no further testing will be indicated   Recommendations:   Follow up pending on the results of neuropsych evaluation Neuropsych evaluation for diagnostic clarity  Recommend good control of cardiovascular risk factors Continue to control mood as per PCP     Subjective:   This patient is here alone. Previous records as well as any outside records available were reviewed prior to todays visit.   Patient was last seen on 02/07/2024 with [facility] 27/30. [name]    Any changes in memory since last visit?   About the same .  She continues to need clues to remember.  She has more difficulty with new information than long-term memory.  repeats oneself?  Endorsed Disoriented when walking into a room?  Patient  denies    Misplacing objects?  Patient denies   Wandering behavior?   Denies. Any personality changes since last visit? Denies.   Any worsening depression?: denies.  As before, she may have some tearful episodes, low level of depression but denies anxiety.  Fatigue?  Reports extreme fatigue for the last 10 years.  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  Denies.   Seizures?   Denies.    Any sleep changes?  Chronically, she does not sleep very well .  Takes Ambien  and gumies.  Sometimes she reports vivid dreams, denies REM behavior or sleepwalking   Sleep apnea?   denies    Any hygiene concerns?   Denies.   Independent of bathing and dressing?  Endorsed  Does the patient needs help with medications? Patient is in charge, occasionally she may forget a dose  Who is in charge of the finances?  Patient and husband are in charge     Any changes in appetite?  denies     Patient have trouble swallowing?  Denies.   Does the patient cook?  Any kitchen accidents such as leaving the stove on?   Denies.   Any headaches?    Denies. Denies vertigo, dizziness, balance issues. Vision changes?  She has cataracts.  Denies double vision, eye pain, nystagmus. Chronic pain?  Denies.   Ambulates with difficulty?    Denies, except for severe fatigue.  She continues to do yoga, strength exercises.  May have some stiffness when waking up.  Denies clonus, spasms.[name]  Recent falls or head injuries?    Denies.      Unilateral weakness, numbness or tingling?  As before, she has random numbness and tingling  in the 4th and 5th finger and  4th/5th toes B for at least 10 years, not today.  He reports vague symptoms of unclear cause including 2 weeks ago my lip moved up -she says Any tremors?  Denies.   Any anosmia?    Denies.   Any incontinence of urine?  She gets up about 4 times at night for at least 5 years, during the day she is well Any bowel dysfunction?   Chronic constipation on FiberCon    Patient lives with her husband and her youngest son.  Does the patient drive?  Yes, denies any issues with  Initial visit March 2025 How long did patient have memory difficulties? For the last 11 years, worse over the last 4 years. She reports trouble processing information, and there are times when she cannot remember past events even if they involve her children, I need some clues to remember .Reports some difficulty remembering new information. She had been seen in 2015 with similar complaints, but it was felt to be due to situational stress and depression, when adapting to a blended family, having a stressful job and finding her biological mother (60 at the time) who had schizophrenia, alcoholism and was living in a group home.She is now denying any anxiety although sometimes finds herself tearful. repeats oneself?  Endorsed,by her children, worse over the last year Disoriented when walking into a room?  Patient denies except occasionally not remembering what patient came to the room for    Leaving objects in unusual places? Denies.   Wandering behavior?  denies .  Any personality changes?  Denies.   Any history of depression?:  Sometimes I feel I have a low level of depression. I become tearful sometimes. I am no longer anxious  Fatigue:reports extreme fatigue for the last 10 years. 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 Hallucinations or paranoia?  Denies   Seizures?  Denies    Any sleep changes?  Does not sleep well  for at least 25 years. Takes Ambien . Sometimes I have vivid dreams, Denies REM behavior or sleepwalking   Sleep apnea?  Denies   Any hygiene concerns?  Denies   Independent of bathing and dressing?  Endorsed  Does the patient needs help with medications? Patient is in charge, occasionally I may forget a dose  Who is in charge of the finances? Patient and husband in charge    Any changes in appetite?  Denies     Patient have  trouble swallowing? Denies.   Does the patient cook? Yes, denies forgetting common recipes Any kitchen accidents such as leaving the stove on? Denies.   Any history of headaches?  Not frequently, can be months before having one., if they do they are dull and they are there the whole day, with photophobia, takes tylenol  with good relief.  Denies vertigo, dizziness, balance issues Chronic pain ? Denies.   Ambulates with difficulty?  Denies.except for severe fatigue. She exercises at O2, does strength and yoga classes. She may have stiffness when waking up. Denies clonus, spasms.  Recent falls or head injuries? Denies.   Vision changes? Endorsed, has cataracts. Denies double vision, eye pain, nystagmus.   Any stroke like symptoms? Random numbness and tingling  in the 4th and 5th finger and  4th/5th toes B for at least 10 years,  not today. Des not drop stuff. In the past she reported patches of decreased sensation in the arms, but not recently. Any tremors?   Denies.   Any anosmia?  Denies.   Any incontinence of urine? Has to get up 4 times a night for at least 5 years. During the day she is well.  Any bowel dysfunction? Constipation, on Fibercon.    Patient lives with husband and her youngest son    History of heavy alcohol intake? Denies.   History of heavy tobacco use? Denies.   Family history of dementia? Biological mother may have had alcoholism and schizophrenia . No history of AD that she is aware of, she is adopted Does patient drive? 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   Pertinent labs B12 445, A1c 5.6, CMP normal, TC 231, LDL 126, iron studies normal, TSH 0.071 with normal T4   MRI brain 10/25/12 remarkable for tiny frontal white matter hyperintensities (2 or 3)   MRI of the brain June 2025, personally reviewed without acute findings, no atrophy or volume change.  5 or 6 punctate foci of T2 flair signal within the subcortical white  matter, not definitely significant was seen, and another punctate foci of enhancement in the inferior right cerebellar and cerebral pons, left basal ganglia are not associated with T2 flair was seen which could represent minimal developmental venous abnormalities.      Past Medical History:  Diagnosis Date   Anemia    Anxiety    Chronic UTI    Headache    Heart murmur    Hypertension    only with prior OCP use   Insomnia    Memory loss 09/28/2021   NSVT (nonsustained ventricular tachycardia) (HCC) 07/12/2021   Post-operative state - [facility] w/ bilat salpingectomy 05/22/2012   Premature ovarian failure    resolved 03/2011 with normal FSH/estradiol   Pure hypercholesterolemia 09/28/2021     Past Surgical History:  Procedure Laterality Date   ABDOMINAL HYSTERECTOMY     BUNIONECTOMY     right foot   HYSTEROSCOPY  2002   LAPAROSCOPIC SUPRACERVICAL HYSTERECTOMY  05/21/2012   Procedure: LAPAROSCOPIC SUPRACERVICAL HYSTERECTOMY;  Surgeon: Burnard A. Kandyce, MD;  Location: [facility];  Service: Gynecology;  Laterality: N/A;   ~Laprascopic Supracervical Hysterectomy Attempted, Converted into a Robotic Assisted Hysterectomy  Laprascopic Supracervical Hysterectomy with Morcellation Possible Robotic Assistance Book in Robot Room Do Not Drape Robot      PREVIOUS MEDICATIONS:   CURRENT MEDICATIONS:  Outpatient Encounter Medications as of 04/18/2024  Medication Sig   CALCIUM-VITAMIN D PO Take 2 tablets by mouth daily.   clonazePAM  (KLONOPIN ) 0.5 MG tablet Take 0.5 mg by mouth at bedtime as needed for anxiety.   co-enzyme Q-10 30 MG capsule Take 30 mg by mouth 3 (three) times daily.   diazepam  (VALIUM ) 2 MG tablet Take 1 tab 30 minutes prior to MRI, may add an additional tab if needed   estradiol (CLIMARA - DOSED IN MG/24 HR) 0.05 mg/24hr patch Place 0.05 mg onto the skin once a week.   LINZESS 72 MCG capsule Take 72 mcg by mouth as needed.   lisinopril  (ZESTRIL ) 10 MG tablet Take 1 tablet (10  mg total) by mouth daily.   LYSINE PO Take by mouth daily at 6 (six) AM.   Misc Natural Products (TUMERSAID) TABS Take by mouth daily at 6 (six) AM.   NP THYROID  30 MG tablet Take 45 mg by mouth every morning.  OVER THE COUNTER MEDICATION Magnesium-Take 1 tablet by mouth daily.   Prasterone, DHEA, (DHEA PO) Take 5 mg by mouth daily.   progesterone (PROMETRIUM) 100 MG capsule SMARTSIG:1 Capsule(s) By Mouth Every Evening   Sodium Hyaluronate, oral, (HYALURONIC ACID) 100 MG CAPS Take by mouth.   UNABLE TO FIND Take 1 tablet by mouth daily. Med Name: Vitamin D3 + K12   UNABLE TO FIND Take 1 tablet by mouth daily. Med Name: Tru Niagen   valACYclovir  (VALTREX ) 1000 MG tablet Take 1,000 mg by mouth as needed.   verapamil  (CALAN ) 40 MG tablet Take 1 tablet (40 mg total) by mouth 2 (two) times daily as needed (Palpitation).   zolpidem  (AMBIEN ) 5 MG tablet Take by mouth.   No facility-administered encounter medications on file as of 04/18/2024.     Objective:     PHYSICAL EXAMINATION:    VITALS:   Vitals:   04/18/24 1416  BP: 114/66  Pulse: 66  Resp: 20  SpO2: 98%  Weight: 119 lb (54 kg)  Height: 5' 4 (1.626 m)    GEN:  The patient appears stated age and is in NAD. HEENT:  Normocephalic, atraumatic.   Neurological examination:  General: NAD, well-groomed, appears stated age. Orientation: The patient is alert. Oriented to person, place and date Cranial nerves: There is good facial symmetry.The speech is fluent and clear. No aphasia or dysarthria. Fund of knowledge is appropriate. Recent memory and remote memory is normal.  Attention and concentration are normal.  Able to name objects and repeat phrases.  Hearing is intact to conversational tone.    Sensation: Sensation is intact to light touch throughout Motor: Strength is at least antigravity x4. DTR's 2/4 on both upper and lower extremities negative Babinski     02/07/2024    9:00 AM  Montreal Cognitive Assessment    Visuospatial/ Executive (0/5) 4  Naming (0/3) 3  Attention: Read list of digits (0/2) 2  Attention: Read list of letters (0/1) 1  Attention: Serial 7 subtraction starting at 100 (0/3) 1  Language: Repeat phrase (0/2) 2  Language : Fluency (0/1) 1  Abstraction (0/2) 2  Delayed Recall (0/5) 5  Orientation (0/6) 6  Total 27  Adjusted Score (based on education) 27        No data to display             Movement examination: Tone: There is normal tone in the UE/LE Abnormal movements:  no tremor.  No myoclonus.  No asterixis.   Coordination:  There is no decremation with RAM's. Normal finger to nose  Gait and Station: The patient has no difficulty arising out of a deep-seated chair without the use of the hands. The patient's stride length is good.  Gait is cautious and narrow.  No foot drop.  Thank you for allowing us  the opportunity to participate in the care of this nice patient. Please do not hesitate to contact us  for any questions or concerns.   Total time spent on today's visit was 32 minutes dedicated to this patient today, preparing to see patient, examining the patient, ordering tests and/or medications and counseling the patient, documenting clinical information in the EHR or other health record, independently interpreting results and communicating results to the patient/family, discussing treatment and goals, answering patient's questions and coordinating care.  Cc:  Nodal, Mickey Browner, PA-C  Camie Sevin 04/21/2024 11:55 AM

## 2024-05-23 ENCOUNTER — Ambulatory Visit: Admitting: Physician Assistant

## 2024-06-12 DIAGNOSIS — R7989 Other specified abnormal findings of blood chemistry: Secondary | ICD-10-CM | POA: Diagnosis not present

## 2024-06-18 DIAGNOSIS — R1084 Generalized abdominal pain: Secondary | ICD-10-CM | POA: Diagnosis not present

## 2024-06-18 DIAGNOSIS — K5900 Constipation, unspecified: Secondary | ICD-10-CM | POA: Diagnosis not present

## 2024-06-25 ENCOUNTER — Ambulatory Visit: Payer: Self-pay

## 2024-06-25 ENCOUNTER — Institutional Professional Consult (permissible substitution): Admitting: Psychology

## 2024-07-08 DIAGNOSIS — R531 Weakness: Secondary | ICD-10-CM | POA: Diagnosis not present

## 2024-07-08 DIAGNOSIS — E162 Hypoglycemia, unspecified: Secondary | ICD-10-CM | POA: Diagnosis not present

## 2024-07-10 DIAGNOSIS — R531 Weakness: Secondary | ICD-10-CM | POA: Diagnosis not present

## 2024-07-15 ENCOUNTER — Encounter: Admitting: Psychology

## 2024-07-22 DIAGNOSIS — E162 Hypoglycemia, unspecified: Secondary | ICD-10-CM | POA: Diagnosis not present

## 2024-07-30 DIAGNOSIS — R899 Unspecified abnormal finding in specimens from other organs, systems and tissues: Secondary | ICD-10-CM | POA: Diagnosis not present

## 2024-07-30 DIAGNOSIS — E039 Hypothyroidism, unspecified: Secondary | ICD-10-CM | POA: Diagnosis not present

## 2024-08-18 ENCOUNTER — Telehealth: Payer: Self-pay

## 2024-08-18 NOTE — Telephone Encounter (Signed)
 [facility] called patient cancelled follow up MRI brain wo contrast for 09/04/2024. Also she cancelled Neuropschy testing fyi.

## 2024-09-04 ENCOUNTER — Other Ambulatory Visit

## 2024-09-09 DIAGNOSIS — Z23 Encounter for immunization: Secondary | ICD-10-CM | POA: Diagnosis not present
